# Patient Record
Sex: Male | Born: 1965
Health system: Southern US, Community
[De-identification: ages and names within clinical notes are randomized; demographics above are authoritative.]

## PROBLEM LIST (undated history)

## (undated) DIAGNOSIS — M25529 Pain in unspecified elbow: Secondary | ICD-10-CM

## (undated) DIAGNOSIS — K219 Gastro-esophageal reflux disease without esophagitis: Secondary | ICD-10-CM

## (undated) DIAGNOSIS — I10 Essential (primary) hypertension: Secondary | ICD-10-CM

## (undated) DIAGNOSIS — M25569 Pain in unspecified knee: Secondary | ICD-10-CM

## (undated) HISTORY — PX: NO PAST SURGERIES: SHX2092

## (undated) HISTORY — DX: Pain in unspecified elbow: M25.529

## (undated) HISTORY — DX: Gastro-esophageal reflux disease without esophagitis: K21.9

## (undated) HISTORY — DX: Pain in unspecified knee: M25.569

---

## 2004-06-01 ENCOUNTER — Emergency Department (HOSPITAL_COMMUNITY): Admission: EM | Admit: 2004-06-01 | Discharge: 2004-06-01 | Payer: Self-pay | Admitting: *Deleted

## 2008-10-24 ENCOUNTER — Encounter: Admission: RE | Admit: 2008-10-24 | Discharge: 2008-10-24 | Payer: Self-pay | Admitting: Family Medicine

## 2009-11-11 ENCOUNTER — Encounter: Admission: RE | Admit: 2009-11-11 | Discharge: 2009-11-11 | Payer: Self-pay | Admitting: Family Medicine

## 2016-03-13 ENCOUNTER — Emergency Department (HOSPITAL_COMMUNITY): Payer: Managed Care, Other (non HMO)

## 2016-03-13 ENCOUNTER — Encounter (HOSPITAL_COMMUNITY): Payer: Self-pay | Admitting: *Deleted

## 2016-03-13 ENCOUNTER — Emergency Department (HOSPITAL_COMMUNITY)
Admission: EM | Admit: 2016-03-13 | Discharge: 2016-03-13 | Disposition: A | Discharge status: Home / Self Care | Payer: Managed Care, Other (non HMO) | Attending: Emergency Medicine | Admitting: Emergency Medicine

## 2016-03-13 DIAGNOSIS — Z23 Encounter for immunization: Secondary | ICD-10-CM | POA: Diagnosis not present

## 2016-03-13 DIAGNOSIS — Z7982 Long term (current) use of aspirin: Secondary | ICD-10-CM | POA: Diagnosis not present

## 2016-03-13 DIAGNOSIS — Z79899 Other long term (current) drug therapy: Secondary | ICD-10-CM | POA: Diagnosis not present

## 2016-03-13 DIAGNOSIS — I10 Essential (primary) hypertension: Secondary | ICD-10-CM | POA: Diagnosis not present

## 2016-03-13 DIAGNOSIS — M25562 Pain in left knee: Secondary | ICD-10-CM | POA: Diagnosis present

## 2016-03-13 HISTORY — DX: Essential (primary) hypertension: I10

## 2016-03-13 LAB — BASIC METABOLIC PANEL
Anion gap: 7 (ref 5–15)
BUN: 25 mg/dL — AB (ref 6–20)
CO2: 27 mmol/L (ref 22–32)
CREATININE: 0.84 mg/dL (ref 0.61–1.24)
Calcium: 9.4 mg/dL (ref 8.9–10.3)
Chloride: 105 mmol/L (ref 101–111)
GFR calc Af Amer: >60 mL/min (ref >60)
Glucose, Bld: 113 mg/dL — ABNORMAL HIGH (ref 65–99)
Potassium: 4 mmol/L (ref 3.5–5.1)
SODIUM: 139 mmol/L (ref 135–145)

## 2016-03-13 LAB — CBC WITH DIFFERENTIAL/PLATELET
Basophils Absolute: 0 10*3/uL (ref 0.0–0.1)
Basophils Relative: 0 %
EOS ABS: 0.2 10*3/uL (ref 0.0–0.7)
EOS PCT: 3 %
HCT: 47.8 % (ref 39.0–52.0)
Hemoglobin: 16.1 g/dL (ref 13.0–17.0)
LYMPHS ABS: 2.1 10*3/uL (ref 0.7–4.0)
Lymphocytes Relative: 31 %
MCH: 30 pg (ref 26.0–34.0)
MCHC: 33.7 g/dL (ref 30.0–36.0)
MCV: 89.2 fL (ref 78.0–100.0)
MONOS PCT: 9 %
Monocytes Absolute: 0.6 10*3/uL (ref 0.1–1.0)
Neutro Abs: 3.9 10*3/uL (ref 1.7–7.7)
Neutrophils Relative %: 57 %
PLATELETS: 178 10*3/uL (ref 150–400)
RBC: 5.36 MIL/uL (ref 4.22–5.81)
RDW: 12.8 % (ref 11.5–15.5)
WBC: 6.8 10*3/uL (ref 4.0–10.5)

## 2016-03-13 LAB — C-REACTIVE PROTEIN: CRP: 1.1 mg/dL — ABNORMAL HIGH (ref <1.0)

## 2016-03-13 LAB — SEDIMENTATION RATE: SED RATE: 12 mm/h (ref 0–16)

## 2016-03-13 MED ORDER — LIDOCAINE-EPINEPHRINE (PF) 1 %-1:200000 IJ SOLN
INTRAMUSCULAR | Status: AC
  Filled 2016-03-13: qty 30

## 2016-03-13 MED ORDER — HYDROCODONE-ACETAMINOPHEN 5-325 MG PO TABS: 2.0000 | ORAL_TABLET | ORAL | Status: DC | PRN

## 2016-03-13 MED ORDER — HYDROCODONE-ACETAMINOPHEN 5-325 MG PO TABS
1.0000 | ORAL_TABLET | Freq: Once | ORAL | Status: AC
  Administered 2016-03-13: 1 via ORAL
  Filled 2016-03-13: qty 1

## 2016-03-13 MED ORDER — LIDOCAINE-EPINEPHRINE (PF) 1 %-1:200000 IJ SOLN
20.0000 mL | Freq: Once | INTRAMUSCULAR | Status: DC
  Filled 2016-03-13: qty 20

## 2016-03-13 MED ORDER — TETANUS-DIPHTH-ACELL PERTUSSIS 5-2.5-18.5 LF-MCG/0.5 IM SUSP
0.5000 mL | Freq: Once | INTRAMUSCULAR | Status: AC
  Administered 2016-03-13: 0.5 mL via INTRAMUSCULAR
  Filled 2016-03-13: qty 0.5

## 2016-03-13 NOTE — ED Provider Notes (Signed)
CSN: [id number]     Arrival date & time 03/13/16  0331 History   First MD Initiated Contact with Patient 03/13/16 0435     Chief Complaint  Patient presents with  . Knee Pain     (Consider location/radiation/quality/duration/timing/severity/associated sxs/prior Treatment) HPI Comments: Patient complains of left knee pain ongoing since May 26. He states he hit his knee on the edge of his trailer and has had suprapatellar pain since then. He has had increased pain tonight with difficulty walking. He was seen at urgent care on May 31 and started on doxycycline for possible cellulitis which he's been taking. He also told urgent care that he had a tick bite to his knee several weeks ago. He denies any fever, chills, nausea or vomiting. No chest pain or shortness of breath. No previous issues with this knee. No significant bleeding or drainage. No weakness, numbness or tingling.  The history is provided by the patient and a relative.    Past Medical History  Diagnosis Date  . Hypertension    History reviewed. No pertinent past surgical history. No family history on file. Social History  Substance Use Topics  . Smoking status: Never Smoker   . Smokeless tobacco: None  . Alcohol Use: No    Review of Systems  Constitutional: Negative for chills, activity change, appetite change and fatigue.  HENT: Negative for congestion and rhinorrhea.   Respiratory: Negative for cough, chest tightness and shortness of breath.   Cardiovascular: Negative for chest pain and leg swelling.  Gastrointestinal: Negative for nausea, vomiting and abdominal pain.  Genitourinary: Negative for dysuria and hematuria.  Musculoskeletal: Positive for myalgias and arthralgias.  Skin: Positive for wound. Negative for rash.  Neurological: Negative for dizziness, weakness and headaches.  A complete 10 system review of systems was obtained and all systems are negative except as noted in the HPI and PMH.      Allergies   Review of patient's allergies indicates no known allergies.  Home Medications   Prior to Admission medications   Medication Sig Start Date End Date Taking? Authorizing Provider  aspirin 81 MG tablet Take 81 mg by mouth daily.   Yes Historical Provider, MD  doxycycline (VIBRAMYCIN) 100 MG capsule Take 100 mg by mouth 2 (two) times daily.   Yes Historical Provider, MD  hydrochlorothiazide (HYDRODIURIL) 25 MG tablet Take 25 mg by mouth daily.   Yes Historical Provider, MD   BP 156/91 mmHg  Pulse 82  Temp(Src) 99.2 F (37.3 C) (Oral)  Resp 20  Ht 6' 3"  (1.905 m)  Wt 262 lb (118.842 kg)  BMI 32.75 kg/m2  SpO2 97% Physical Exam  Constitutional: He is oriented to person, place, and time. He appears well-developed and well-nourished. No distress.  HENT:  Head: Normocephalic and atraumatic.  Mouth/Throat: Oropharynx is clear and moist. No oropharyngeal exudate.  Eyes: Conjunctivae and EOM are normal. Pupils are equal, round, and reactive to light.  Neck: Normal range of motion. Neck supple.  No meningismus.  Cardiovascular: Normal rate, regular rhythm, normal heart sounds and intact distal pulses.   No murmur heard. Pulmonary/Chest: Effort normal and breath sounds normal. No respiratory distress.  Abdominal: Soft. There is no tenderness. There is no rebound and no guarding.  Musculoskeletal: Normal range of motion. He exhibits edema and tenderness.  Small abrasion left suprapatellar area. There is no surrounding erythema. Left knee joint itself shows mild effusion. There is mild warmth but no erythema. Flexion and extension are intact but limited by  pain. No pain with manipulation of patella Intact DP and PT pulses.  Neurological: He is alert and oriented to person, place, and time. No cranial nerve deficit. He exhibits normal muscle tone. Coordination normal.  No ataxia on finger to nose bilaterally. No pronator drift. 5/5 strength throughout. CN 2-12 intact.Equal grip strength.  Sensation intact.   Skin: Skin is warm.  Psychiatric: He has a normal mood and affect. His behavior is normal.  Nursing note and vitals reviewed.   ED Course  .Joint Aspiration/Arthrocentesis Date/Time: 03/13/2016 8:20 AM Performed by: Ezequiel Essex Authorized by: Ezequiel Essex Consent: Verbal consent obtained. Risks and benefits: risks, benefits and alternatives were discussed Consent given by: patient Patient understanding: patient states understanding of the procedure being performed Patient consent: the patient's understanding of the procedure matches consent given Patient identity confirmed: provided demographic data and verbally with patient Time out: Immediately prior to procedure a "time out" was called to verify the correct patient, procedure, equipment, support staff and site/side marked as required. Indications: joint swelling,  pain,  possible septic joint and diagnostic evaluation  Body area: knee Joint: left knee Local anesthesia used: yes Anesthesia: local infiltration Local anesthetic: lidocaine 1% with epinephrine Anesthetic total: 4 ml Patient sedated: no Preparation: Patient was prepped and draped in the usual sterile fashion. Needle gauge: 18 G Ultrasound guidance: no Approach: medial Aspirate amount: 0 mL Patient tolerance: Patient tolerated the procedure well with no immediate complications Comments: No synovial fluid obtained. Patient requests to stop and declines further attempts   (including critical care time) Labs Review Labs Reviewed  CBC WITH DIFFERENTIAL/PLATELET  BASIC METABOLIC PANEL  SEDIMENTATION RATE  C-REACTIVE PROTEIN    Imaging Review No results found. I have personally reviewed and evaluated these images and lab results as part of my medical decision-making.   EKG Interpretation None      MDM   Final diagnoses:  Left knee pain   Ongoing left knee pain since sustaining abrasion 2 weeks ago. No fever. Mentions "stool  darker than normal" yesterday. No frank blood, not black.  He declines rectal exam.  No cellulitis on exam. Septic joint considered but seems unlikely with minimal effusion and intact ROM. Patient is able to flex and extend his knee without difficulty.  We'll update tetanus. We'll obtain x-rays patient as states this was not done.  Xray negative. No joint effusion. WBC normal. Afebrile.  Hemoglobin 16, vitals stable.  He declines rectal exam or any evaluation of his reported dark stools  Discussed with patient risks and benefits of aspiration. He has a great deal of pain with range of motion and some warmth but no significant erythema. No fever. No leukocytosis. ESR normal.  Patient initially declines aspiration but now is agreeable. Aspiration attempted as above. No synovial fluid obtained. Patient declines further aspiration attempts. Continue doxycycline., Follow-up with orthopedics tomorrow. If unable to see orthopedics, recommended return to emergency department for reevaluation. Return sooner with worsening pain, fever, vomiting or any other concerns.  Ezequiel Essex, MD 03/13/16 [phone]

## 2016-03-13 NOTE — ED Notes (Signed)
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

## 2016-03-13 NOTE — Discharge Instructions (Signed)
Joint Pain Follow up with Dr. Romeo AppleHarrison tomorrow. If unable to see him, return to the ED for a recheck. Continue the antibiotics. Return to the ED sooner if you develop worsening pain, redness, fever, or any other concerns. Joint pain, which is also called arthralgia, can be caused by many things. Joint pain often goes away when you follow your health care provider's instructions for relieving pain at home. However, joint pain can also be caused by conditions that require further treatment. Common causes of joint pain include:  Bruising in the area of the joint.  Overuse of the joint.  Wear and tear on the joints that occur with aging (osteoarthritis).  Various other forms of arthritis.  A buildup of a crystal form of uric acid in the joint (gout).  Infections of the joint (septic arthritis) or of the bone (osteomyelitis). Your health care provider may recommend medicine to help with the pain. If your joint pain continues, additional tests may be needed to diagnose your condition. HOME CARE INSTRUCTIONS Watch your condition for any changes. Follow these instructions as directed to lessen the pain that you are feeling.  Take medicines only as directed by your health care provider.  Rest the affected area for as long as your health care provider says that you should. If directed to do so, raise the painful joint above the level of your heart while you are sitting or lying down.  Do not do things that cause or worsen pain.  If directed, apply ice to the painful area:  Put ice in a plastic bag.  Place a towel between your skin and the bag.  Leave the ice on for 20 minutes, 2-3 times per day.  Wear an elastic bandage, splint, or sling as directed by your health care provider. Loosen the elastic bandage or splint if your fingers or toes become numb and tingle, or if they turn cold and blue.  Begin exercising or stretching the affected area as directed by your health care provider. Ask  your health care provider what types of exercise are safe for you.  Keep all follow-up visits as directed by your health care provider. This is important. SEEK MEDICAL CARE IF:  Your pain increases, and medicine does not help.  Your joint pain does not improve within 3 days.  You have increased bruising or swelling.  You have a fever.  You lose 10 lb (4.5 kg) or more without trying. SEEK IMMEDIATE MEDICAL CARE IF:  You are not able to move the joint.  Your fingers or toes become numb or they turn cold and blue.   This information is not intended to replace advice given to you by your health care provider. Make sure you discuss any questions you have with your health care provider.   Document Released: 09/26/2005 Document Revised: 10/17/2014 Document Reviewed: 07/08/2014 Elsevier Interactive Patient Education [employer]2016 Elsevier Inc.

## 2016-03-13 NOTE — ED Notes (Addendum)
Ice Pak given at this time. 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 States that he went to urgent care, put on antibiotics. States that he is having black stools.

## 2016-03-14 ENCOUNTER — Encounter: Payer: Self-pay | Admitting: Orthopedic Surgery

## 2016-03-14 ENCOUNTER — Ambulatory Visit (INDEPENDENT_AMBULATORY_CARE_PROVIDER_SITE_OTHER): Payer: Managed Care, Other (non HMO) | Admitting: Orthopedic Surgery

## 2016-03-14 VITALS — BP 136/100 | Ht 75.0 in | Wt 262.0 lb

## 2016-03-14 DIAGNOSIS — M25469 Effusion, unspecified knee: Secondary | ICD-10-CM | POA: Diagnosis not present

## 2016-03-14 LAB — CBC WITH DIFFERENTIAL/PLATELET
BASOS PCT: 0 %
Basophils Absolute: 0 cells/uL (ref 0–200)
Eosinophils Absolute: 138 cells/uL (ref 15–500)
Eosinophils Relative: 2 %
HEMATOCRIT: 49 % (ref 38.5–50.0)
HEMOGLOBIN: 17.2 g/dL — AB (ref 13.2–17.1)
LYMPHS ABS: 2001 {cells}/uL (ref 850–3900)
Lymphocytes Relative: 29 %
MCH: 30.7 pg (ref 27.0–33.0)
MCHC: 35.1 g/dL (ref 32.0–36.0)
MCV: 87.5 fL (ref 80.0–100.0)
MONO ABS: 690 {cells}/uL (ref 200–950)
MPV: 10.3 fL (ref 7.5–12.5)
Monocytes Relative: 10 %
NEUTROS PCT: 59 %
Neutro Abs: 4071 cells/uL (ref 1500–7800)
Platelets: 198 10*3/uL (ref 140–400)
RBC: 5.6 MIL/uL (ref 4.20–5.80)
RDW: 13.6 % (ref 11.0–15.0)
WBC: 6.9 10*3/uL (ref 3.8–10.8)

## 2016-03-14 LAB — SEDIMENTATION RATE: SED RATE: 17 mm/h — AB (ref 0–15)

## 2016-03-14 LAB — C-REACTIVE PROTEIN

## 2016-03-14 LAB — URIC ACID: Uric Acid, Serum: 7.5 mg/dL (ref 4.0–8.0)

## 2016-03-14 MED ORDER — PREDNISONE 10 MG (48) PO TBPK: ORAL_TABLET | ORAL | Status: DC

## 2016-03-14 MED ORDER — HYDROCODONE-ACETAMINOPHEN 5-325 MG PO TABS: 1.0000 | ORAL_TABLET | Freq: Four times a day (QID) | ORAL | Status: DC | PRN

## 2016-03-14 NOTE — Progress Notes (Addendum)
Chief Complaint  Patient presents with  . Knee Injury    ER FOLLOW UP LEFT KNEE INJURY, DOI 03/04/16    Medical decision-making  X-rays personal interpretation 4 views of the knee no fracture no dislocation or significant degenerative change  Report read as IMPRESSION: 1. No acute osseous abnormality about the knee. 2. Mild degenerative osteoarthritic changes involving the patellofemoral joint space compartment.     Electronically Signed   By: Jeannine Boga M.D.   On: 03/13/2016 05:59  Diagnosis left knee pain possible infection possible posttraumatic synovitis  Recommend laboratory studies to be done CBC with differential, C-reactive protein, sedimentation rate  LAB RESULT: 03/15/16 WBC 6.9  URIC ACID 7.5 (NL 4-8) CRP<0.6 AND ESR 17 (NL =0-15) 03/15/2016 9:37 AM  Come back 2 days  Prescriptions written for pain Steroids added  Stop ibuprofen   HPI 50 year old male who injured his left knee kneecap versus trailer about 3 days later saw him pain and swelling went to the ER didn't aspiration was negative ultrasound showed minimal fluid in the joint was put on doxycycline for presumed prophylactic antibiotics for infection  Comes in today complaining of anterior left knee pain diffuse joint pain dull throbbing severe now of 10 days duration pain has become constant and he has trouble bending his knee  Review of Systems  Constitutional: Negative for fever and chills.  Skin: Negative for itching and rash.  Neurological: Negative for tingling.    Past Medical History  Diagnosis Date  . Hypertension     No past surgical history on file. No family history on file. Social History  Substance Use Topics  . Smoking status: Never Smoker   . Smokeless tobacco: None  . Alcohol Use: No    Current outpatient prescriptions:  .  aspirin 81 MG tablet, Take 81 mg by mouth daily., Disp: , Rfl:  .  doxycycline (VIBRAMYCIN) 100 MG capsule, Take 100 mg by mouth 2 (two) times  daily., Disp: , Rfl:  .  hydrochlorothiazide (HYDRODIURIL) 25 MG tablet, Take 25 mg by mouth daily., Disp: , Rfl:  .  HYDROcodone-acetaminophen (NORCO/VICODIN) 5-325 MG tablet, Take 1 tablet by mouth every 6 (six) hours as needed., Disp: 56 tablet, Rfl: 0 .  predniSONE (STERAPRED UNI-PAK 48 TAB) 10 MG (48) TBPK tablet, Use as directed, Disp: 48 tablet, Rfl: 0  BP 136/100 mmHg  Ht 6' 3"  (1.905 m)  Wt 262 lb (118.842 kg)  BMI 32.75 kg/m2  Physical Exam  Constitutional: He is oriented to person, place, and time. He appears well-developed and well-nourished. No distress.  Cardiovascular: Normal rate and intact distal pulses.   Neurological: He is alert and oriented to person, place, and time.  Skin: Skin is warm and dry. No rash noted. He is not diaphoretic. No erythema. No pallor.  Psychiatric: He has a normal mood and affect. His behavior is normal. Judgment and thought content normal.    Ortho Exam Ambulatory status is walking with crutches he has a significant limp. His knee is swollen but the joint has no fluid. He can only bend his knee 30. He has no extensor lag with straight leg raises knee is stable and the collateral and sagittal plane is no weakness in the knee and no atrophy. The skin is not red but there is an abrasion superior pole of patella. Distal pulses intact temperature normal no edema lymph nodes are negative sensation is normal

## 2016-03-15 NOTE — Addendum Note (Signed)
Addended by: Vickki HearingHARRISON, STANLEY E on: 03/15/2016 09:37 AM   Modules accepted: Kipp BroodSmartSet

## 2016-03-16 ENCOUNTER — Ambulatory Visit (INDEPENDENT_AMBULATORY_CARE_PROVIDER_SITE_OTHER): Payer: Managed Care, Other (non HMO) | Admitting: Orthopedic Surgery

## 2016-03-16 VITALS — BP 143/94 | HR 101 | Ht 75.0 in | Wt 257.6 lb

## 2016-03-16 DIAGNOSIS — M25562 Pain in left knee: Secondary | ICD-10-CM

## 2016-03-16 DIAGNOSIS — M25469 Effusion, unspecified knee: Secondary | ICD-10-CM | POA: Diagnosis not present

## 2016-03-16 NOTE — Progress Notes (Signed)
Patient ID: Seth Garcia, male   DOB: [birth year], 50 y.o.   MRN: [id number]  Chief Complaint  Patient presents with  . Follow-up    Left knee    HPI - PRIOR HPI 50 year old male who injured his left knee kneecap versus trailer about 3 days later saw him pain and swelling went to the ER didn't aspiration was negative ultrasound showed minimal fluid in the joint was put on doxycycline for presumed prophylactic antibiotics for infection  Comes in today complaining of anterior left knee pain diffuse joint pain dull throbbing severe now of 10 days duration pain has become constant and he has trouble bending his knee   ROS  Review of Systems  Constitutional: Negative for fever and chills.  Skin: Negative for itching and rash.  Neurological: Negative for tingling.   TODAY HE SAYS THE KNEE IS MUCH BETTER  HE CAN BEND IT AND HIS PAIN IS SIGNIF. LESS   BP 143/94 mmHg  Pulse 101  Ht 6' 3"  (1.905 m)  Wt 257 lb 9.6 oz (116.847 kg)  BMI 32.20 kg/m2 Gen. appearance is normal grooming and hygiene Orientation to person place and time normal Mood normal Gait is normal NOW No peripheral edema or swelling is noted in the RIGHT LEG Sensory exam shows normal sensation to palpation, pressure and soft touch Skin exam no lacerations ulcerations or erythema  Left Knee Exam   Tenderness  The patient is experiencing no tenderness.     Range of Motion  The patient has normal left knee ROM.  Other  Erythema: absent Sensation: normal Pulse: present Swelling: none Effusion: no effusion present       A/P  Medical decision-making  LABS WERE NORMAL WITH ESR 17  Arther Abbott, MD 03/16/2016 4:03 PM

## 2016-04-14 ENCOUNTER — Ambulatory Visit: Payer: Managed Care, Other (non HMO) | Admitting: Orthopedic Surgery

## 2016-12-16 DIAGNOSIS — M79671 Pain in right foot: Secondary | ICD-10-CM | POA: Diagnosis not present

## 2016-12-16 DIAGNOSIS — G8929 Other chronic pain: Secondary | ICD-10-CM | POA: Diagnosis not present

## 2016-12-16 DIAGNOSIS — M722 Plantar fascial fibromatosis: Secondary | ICD-10-CM | POA: Diagnosis not present

## 2016-12-16 DIAGNOSIS — M6701 Short Achilles tendon (acquired), right ankle: Secondary | ICD-10-CM | POA: Diagnosis not present

## 2017-08-01 DIAGNOSIS — Z23 Encounter for immunization: Secondary | ICD-10-CM | POA: Diagnosis not present

## 2017-08-01 DIAGNOSIS — Z125 Encounter for screening for malignant neoplasm of prostate: Secondary | ICD-10-CM | POA: Diagnosis not present

## 2017-08-01 DIAGNOSIS — E785 Hyperlipidemia, unspecified: Secondary | ICD-10-CM | POA: Diagnosis not present

## 2017-08-01 DIAGNOSIS — Z0000 Encounter for general adult medical examination without abnormal findings: Secondary | ICD-10-CM | POA: Diagnosis not present

## 2017-08-22 DIAGNOSIS — I10 Essential (primary) hypertension: Secondary | ICD-10-CM | POA: Diagnosis not present

## 2017-12-28 DIAGNOSIS — M722 Plantar fascial fibromatosis: Secondary | ICD-10-CM | POA: Diagnosis not present

## 2017-12-28 DIAGNOSIS — M71571 Other bursitis, not elsewhere classified, right ankle and foot: Secondary | ICD-10-CM | POA: Diagnosis not present

## 2017-12-28 DIAGNOSIS — M7731 Calcaneal spur, right foot: Secondary | ICD-10-CM | POA: Diagnosis not present

## 2017-12-28 DIAGNOSIS — M76821 Posterior tibial tendinitis, right leg: Secondary | ICD-10-CM | POA: Diagnosis not present

## 2018-01-04 DIAGNOSIS — M722 Plantar fascial fibromatosis: Secondary | ICD-10-CM | POA: Diagnosis not present

## 2018-01-04 DIAGNOSIS — M71571 Other bursitis, not elsewhere classified, right ankle and foot: Secondary | ICD-10-CM | POA: Diagnosis not present

## 2018-01-15 DIAGNOSIS — M71571 Other bursitis, not elsewhere classified, right ankle and foot: Secondary | ICD-10-CM | POA: Diagnosis not present

## 2018-01-15 DIAGNOSIS — M722 Plantar fascial fibromatosis: Secondary | ICD-10-CM | POA: Diagnosis not present

## 2018-08-09 DIAGNOSIS — R7309 Other abnormal glucose: Secondary | ICD-10-CM | POA: Diagnosis not present

## 2018-08-09 DIAGNOSIS — Z136 Encounter for screening for cardiovascular disorders: Secondary | ICD-10-CM | POA: Diagnosis not present

## 2018-08-09 DIAGNOSIS — Z125 Encounter for screening for malignant neoplasm of prostate: Secondary | ICD-10-CM | POA: Diagnosis not present

## 2018-08-09 DIAGNOSIS — Z0000 Encounter for general adult medical examination without abnormal findings: Secondary | ICD-10-CM | POA: Diagnosis not present

## 2018-08-09 DIAGNOSIS — E785 Hyperlipidemia, unspecified: Secondary | ICD-10-CM | POA: Diagnosis not present

## 2018-08-09 DIAGNOSIS — Z23 Encounter for immunization: Secondary | ICD-10-CM | POA: Diagnosis not present

## 2018-08-22 DIAGNOSIS — L821 Other seborrheic keratosis: Secondary | ICD-10-CM | POA: Diagnosis not present

## 2018-08-22 DIAGNOSIS — D225 Melanocytic nevi of trunk: Secondary | ICD-10-CM | POA: Diagnosis not present

## 2018-08-22 DIAGNOSIS — L820 Inflamed seborrheic keratosis: Secondary | ICD-10-CM | POA: Diagnosis not present

## 2019-08-29 DIAGNOSIS — Z125 Encounter for screening for malignant neoplasm of prostate: Secondary | ICD-10-CM | POA: Diagnosis not present

## 2019-08-29 DIAGNOSIS — R7309 Other abnormal glucose: Secondary | ICD-10-CM | POA: Diagnosis not present

## 2019-08-29 DIAGNOSIS — Z23 Encounter for immunization: Secondary | ICD-10-CM | POA: Diagnosis not present

## 2019-08-29 DIAGNOSIS — I10 Essential (primary) hypertension: Secondary | ICD-10-CM | POA: Diagnosis not present

## 2019-08-29 DIAGNOSIS — E785 Hyperlipidemia, unspecified: Secondary | ICD-10-CM | POA: Diagnosis not present

## 2019-08-29 DIAGNOSIS — Z0000 Encounter for general adult medical examination without abnormal findings: Secondary | ICD-10-CM | POA: Diagnosis not present

## 2019-09-12 DIAGNOSIS — I10 Essential (primary) hypertension: Secondary | ICD-10-CM | POA: Diagnosis not present

## 2019-09-25 ENCOUNTER — Encounter: Payer: Self-pay | Admitting: General Practice

## 2019-11-01 ENCOUNTER — Ambulatory Visit (INDEPENDENT_AMBULATORY_CARE_PROVIDER_SITE_OTHER): Payer: BC Managed Care – PPO | Admitting: Interventional Cardiology

## 2019-11-01 ENCOUNTER — Other Ambulatory Visit: Payer: Self-pay

## 2019-11-01 ENCOUNTER — Encounter (INDEPENDENT_AMBULATORY_CARE_PROVIDER_SITE_OTHER): Payer: Self-pay

## 2019-11-01 ENCOUNTER — Encounter: Payer: Self-pay | Admitting: Interventional Cardiology

## 2019-11-01 VITALS — BP 150/84 | HR 65 | Ht 75.0 in | Wt 278.8 lb

## 2019-11-01 DIAGNOSIS — R7303 Prediabetes: Secondary | ICD-10-CM | POA: Diagnosis not present

## 2019-11-01 DIAGNOSIS — R072 Precordial pain: Secondary | ICD-10-CM | POA: Diagnosis not present

## 2019-11-01 DIAGNOSIS — I10 Essential (primary) hypertension: Secondary | ICD-10-CM

## 2019-11-01 NOTE — Patient Instructions (Addendum)
Medication Instructions:  Your physician recommends that you continue on your current medications as directed. Please refer to the Current Medication list given to you today.  *If you need a refill on your cardiac medications before your next appointment, please call your pharmacy*  Lab Work: None ordered  If you have labs (blood work) drawn today and your tests are completely normal, you will receive your results only by: Marland Kitchen MyChart Message (if you have MyChart) OR . A paper copy in the mail If you have any lab test that is abnormal or we need to change your treatment, we will call you to review the results.  Testing/Procedures: You will have to have a COVID test done prior to you exercise tolerance test. Your Pre-procedure COVID-19 Testing will be done on ____ at [facility] at [address], [place], [place] [address]. Once you arrive at the testing site, stay in the right hand lane, go under the building overhang not the tent. If you are tested under the tent your results may not be back before your procedure. Please be on time for your appointment.  After your swab you will be given a mask to wear and instructed to go home and quarantine/no visitors until after your procedure. If you test positive you will be notified and your procedure will be cancelled.   Your physician has requested that you have an exercise tolerance test. For further information please visit [email or web address] Please also follow instruction sheet, as given.   Follow-Up: AS NEEDED  Other Instructions   Heart-Healthy Eating Plan Heart-healthy meal planning includes:  Eating less unhealthy fats.  Eating more healthy fats.  Making other changes in your diet. Talk with your doctor or a diet specialist (dietitian) to create an eating plan that is right for you. What is my plan? Your doctor may recommend an eating plan that includes:  Total fat: ______% or less of total  calories a day.  Saturated fat: ______% or less of total calories a day.  Cholesterol: less than _________mg a day. What are tips for following this plan? Cooking Avoid frying your food. Try to bake, boil, grill, or broil it instead. You can also reduce fat by:  Removing the skin from poultry.  Removing all visible fats from meats.  Steaming vegetables in water or broth. Meal planning   At meals, divide your plate into four equal parts: ? Fill one-half of your plate with vegetables and green salads. ? Fill one-fourth of your plate with whole grains. ? Fill one-fourth of your plate with lean protein foods.  Eat 4-5 servings of vegetables per day. A serving of vegetables is: ? 1 cup of raw or cooked vegetables. ? 2 cups of raw leafy greens.  Eat 4-5 servings of fruit per day. A serving of fruit is: ? 1 medium whole fruit. ?  cup of dried fruit. ?  cup of fresh, frozen, or canned fruit. ?  cup of 100% fruit juice.  Eat more foods that have soluble fiber. These are apples, broccoli, carrots, beans, peas, and barley. Try to get 20-30 g of fiber per day.  Eat 4-5 servings of nuts, legumes, and seeds per week: ? 1 serving of dried beans or legumes equals  cup after being cooked. ? 1 serving of nuts is  cup. ? 1 serving of seeds equals 1 tablespoon. General information  Eat more home-cooked food. Eat less restaurant, buffet, and fast food.  Limit or avoid alcohol.  Limit foods that are high in starch and sugar.  Avoid fried foods.  Lose weight if you are overweight.  Keep track of how much salt (sodium) you eat. This is important if you have high blood pressure. Ask your doctor to tell you more about this.  Try to add vegetarian meals each week. Fats  Choose healthy fats. These include olive oil and canola oil, flaxseeds, walnuts, almonds, and seeds.  Eat more omega-3 fats. These include salmon, mackerel, sardines, tuna, flaxseed oil, and ground flaxseeds. Try to  eat fish at least 2 times each week.  Check food labels. Avoid foods with trans fats or high amounts of saturated fat.  Limit saturated fats. ? These are often found in animal products, such as meats, butter, and cream. ? These are also found in plant foods, such as palm oil, palm kernel oil, and coconut oil.  Avoid foods with partially hydrogenated oils in them. These have trans fats. Examples are stick margarine, some tub margarines, cookies, crackers, and other baked goods. What foods can I eat? Fruits All fresh, canned (in natural juice), or frozen fruits. Vegetables Fresh or frozen vegetables (raw, steamed, roasted, or grilled). Green salads. Grains Most grains. Choose whole wheat and whole grains most of the time. Rice and pasta, including brown rice and pastas made with whole wheat. Meats and other proteins Lean, well-trimmed beef, veal, pork, and lamb. Chicken and [place] without skin. All fish and shellfish. Wild duck, rabbit, pheasant, and venison. Egg whites or low-cholesterol egg substitutes. Dried beans, peas, lentils, and tofu. Seeds and most nuts. Dairy Low-fat or nonfat cheeses, including ricotta and mozzarella. Skim or 1% milk that is liquid, powdered, or evaporated. Buttermilk that is made with low-fat milk. Nonfat or low-fat yogurt. Fats and oils Non-hydrogenated (trans-free) margarines. Vegetable oils, including soybean, sesame, sunflower, olive, peanut, safflower, corn, canola, and cottonseed. Salad dressings or mayonnaise made with a vegetable oil. Beverages Mineral water. Coffee and tea. Diet carbonated beverages. Sweets and desserts Sherbet, gelatin, and fruit ice. Small amounts of dark chocolate. Limit all sweets and desserts. Seasonings and condiments All seasonings and condiments. The items listed above may not be a complete list of foods and drinks you can eat. Contact a dietitian for more options. What foods should I avoid? Fruits Canned fruit in heavy  syrup. Fruit in cream or butter sauce. Fried fruit. Limit coconut. Vegetables Vegetables cooked in cheese, cream, or butter sauce. Fried vegetables. Grains Breads that are made with saturated or trans fats, oils, or whole milk. Croissants. Sweet rolls. Donuts. High-fat crackers, such as cheese crackers. Meats and other proteins Fatty meats, such as hot dogs, ribs, sausage, bacon, rib-eye roast or steak. High-fat deli meats, such as salami and bologna. Caviar. Domestic duck and goose. Organ meats, such as liver. Dairy Cream, sour cream, cream cheese, and creamed cottage cheese. Whole-milk cheeses. Whole or 2% milk that is liquid, evaporated, or condensed. Whole buttermilk. Cream sauce or high-fat cheese sauce. Yogurt that is made from whole milk. Fats and oils Meat fat, or shortening. Cocoa butter, hydrogenated oils, palm oil, coconut oil, palm kernel oil. Solid fats and shortenings, including bacon fat, salt pork, lard, and butter. Nondairy cream substitutes. Salad dressings with cheese or sour cream. Beverages Regular sodas and juice drinks with added sugar. Sweets and desserts Frosting. Pudding. Cookies. Cakes. Pies. Milk chocolate or white chocolate. Buttered syrups. Full-fat ice cream or ice cream drinks. The items listed above may not be a complete list of foods and drinks  to avoid. Contact a dietitian for more information. Summary  Heart-healthy meal planning includes eating less unhealthy fats, eating more healthy fats, and making other changes in your diet.  Eat a balanced diet. This includes fruits and vegetables, low-fat or nonfat dairy, lean protein, nuts and legumes, whole grains, and heart-healthy oils and fats. This information is not intended to replace advice given to you by your health care provider. Make sure you discuss any questions you have with your health care provider. Document Revised: 11/30/2017 Document Reviewed: 11/03/2017 Elsevier Patient Education  2020 [employer].

## 2019-11-01 NOTE — Progress Notes (Signed)
Cardiology Office Note   Date:  11/01/2019   ID:  Seth Garcia, DOB 17-Jan-1966, MRN [id number]  PCP:  [name], MD    No chief complaint on file.  Chest pain  Wt Readings from Last 3 Encounters:  11/01/19 278 lb 12.8 oz (126.5 kg)  03/16/16 257 lb 9.6 oz (116.8 kg)  03/14/16 262 lb (118.8 kg)       History of Present Illness: Seth Garcia is a 54 y.o. male who is being seen today for the evaluation of chest pain at the request of [name], MD.  He was stretching about a month ago and felt a pop in the center of his chest.  He had some mild pain for a few days.  It resolved on its own, and has not returned.  He did take some chewable pills for GERD and that seemed to help.   He does some landscaping.  No chest pain with that activity.  Walking up stairs does not cause any issues.   He had an ETT about 10-12 years ago.   Father died of MI at age 55.  No early CAD.  4 brothers without heart issues.    Never smoker.      Past Medical History:  Diagnosis Date  . Elbow pain   . GERD (gastroesophageal reflux disease)   . Hypertension   . Knee pain     Past Surgical History:  Procedure Laterality Date  . NO PAST SURGERIES       Current Outpatient Medications  Medication Sig Dispense Refill  . aspirin 81 MG tablet Take 81 mg by mouth daily.    Marland Kitchen EPINEPHrine 0.15 MG/0.15ML IJ injection Inject 0.15 mg into the muscle as needed for anaphylaxis.    . hydrochlorothiazide (HYDRODIURIL) 25 MG tablet Take 25 mg by mouth daily.    Marland Kitchen lisinopril (ZESTRIL) 20 MG tablet Take 20 mg by mouth daily.     No current facility-administered medications for this visit.    Allergies:   Patient has no known allergies.    Social History:  The patient  reports that he has never smoked. He has never used smokeless tobacco. He reports that he does not drink alcohol or use drugs.   Family History:  The patient's family history includes Cancer in his father and mother;  Diabetes in his father and mother; Healthy in his brother, brother, brother, brother, daughter, and daughter; Heart Problems in his mother; Heart attack in his father; Hypertension in his father and mother.    ROS:  Please see the history of present illness.   Otherwise, review of systems are positive for chest pain- resolved.   All other systems are reviewed and negative.    PHYSICAL EXAM: VS:  BP (!) 150/84 (BP Location: Right Arm, Patient Position: Sitting, Cuff Size: Normal)   Pulse 65   Ht 6\' 3"  (1.905 m)   Wt 278 lb 12.8 oz (126.5 kg)   SpO2 98%   BMI 34.85 kg/m  , BMI Body mass index is 34.85 kg/m. GEN: Well nourished, well developed, in no acute distress  HEENT: normal  Neck: no JVD, carotid bruits, or masses Cardiac: RRR; no murmurs, rubs, or gallops,no edema  Respiratory:  clear to auscultation bilaterally, normal work of breathing GI: soft, nontender, nondistended, + BS MS: no deformity or atrophy  Skin: warm and dry, no rash Neuro:  Strength and sensation are intact Psych: euthymic mood, full affect   EKG:  The ekg ordered today demonstrates NSR, no ST changes   Recent Labs: No results found for requested labs within last 8760 hours.   Lipid Panel No results found for: CHOL, TRIG, HDL, CHOLHDL, VLDL, LDLCALC, LDLDIRECT   Other studies Reviewed: Additional studies/ records that were reviewed today with results demonstrating: .   ASSESSMENT AND PLAN:  1. Atypical chest pain: It does not sound cardiac, but has some RF for CAD.  ETT to get a baseline.  Can do after his colonoscopy. Not urgent. 2. HTN: Home readings are in the 128-130's range at home.  CONtinue BP meds.   3. Whole food plant based diet recommended.  A1C 5.8.  Avoid sweets.  Talked about prediabetes.  Mentioned by Dr. Kateri Plummer as well.    Current medicines are reviewed at length with the patient today.  The patient concerns regarding his medicines were addressed.  The following changes have been  made:  No change  Labs/ tests ordered today include: ETT No orders of the defined types were placed in this encounter.   Recommend 150 minutes/week of aerobic exercise Low fat, low carb, high fiber diet recommended  Disposition:   FU for ETT   Signed, Lance Muss, MD  11/01/2019 9:19 AM    [facility] [address] [place], [place], [place]  [address] Phone: [phone]; Fax: [phone]

## 2019-11-12 DIAGNOSIS — Z1159 Encounter for screening for other viral diseases: Secondary | ICD-10-CM | POA: Diagnosis not present

## 2019-11-15 ENCOUNTER — Other Ambulatory Visit (HOSPITAL_COMMUNITY)
Admission: RE | Admit: 2019-11-15 | Discharge: 2019-11-15 | Disposition: A | Discharge status: Home / Self Care | Payer: BC Managed Care – PPO | Source: Ambulatory Visit | Attending: Interventional Cardiology | Admitting: Interventional Cardiology

## 2019-11-15 DIAGNOSIS — Z20822 Contact with and (suspected) exposure to covid-19: Secondary | ICD-10-CM | POA: Diagnosis not present

## 2019-11-15 DIAGNOSIS — D124 Benign neoplasm of descending colon: Secondary | ICD-10-CM | POA: Diagnosis not present

## 2019-11-15 DIAGNOSIS — Z01812 Encounter for preprocedural laboratory examination: Secondary | ICD-10-CM | POA: Insufficient documentation

## 2019-11-15 DIAGNOSIS — Z1211 Encounter for screening for malignant neoplasm of colon: Secondary | ICD-10-CM | POA: Diagnosis not present

## 2019-11-15 DIAGNOSIS — K621 Rectal polyp: Secondary | ICD-10-CM | POA: Diagnosis not present

## 2019-11-15 LAB — SARS CORONAVIRUS 2 (TAT 6-24 HRS): SARS Coronavirus 2: NEGATIVE

## 2019-11-19 ENCOUNTER — Other Ambulatory Visit: Payer: Self-pay

## 2019-11-19 ENCOUNTER — Ambulatory Visit (INDEPENDENT_AMBULATORY_CARE_PROVIDER_SITE_OTHER): Payer: BC Managed Care – PPO

## 2019-11-19 DIAGNOSIS — R072 Precordial pain: Secondary | ICD-10-CM | POA: Diagnosis not present

## 2019-11-19 LAB — EXERCISE TOLERANCE TEST
Estimated workload: 10.1 METS
Exercise duration (min): 8 min
Exercise duration (sec): 0 s
MPHR: 168 {beats}/min
Peak HR: 164 {beats}/min
Percent HR: 97 %
RPE: 17
Rest HR: 88 {beats}/min

## 2020-09-07 DIAGNOSIS — R509 Fever, unspecified: Secondary | ICD-10-CM | POA: Diagnosis not present

## 2020-09-07 DIAGNOSIS — R059 Cough, unspecified: Secondary | ICD-10-CM | POA: Diagnosis not present

## 2020-09-07 DIAGNOSIS — U071 COVID-19: Secondary | ICD-10-CM | POA: Diagnosis not present

## 2020-09-08 ENCOUNTER — Encounter (HOSPITAL_COMMUNITY): Payer: Self-pay | Admitting: Emergency Medicine

## 2020-09-08 ENCOUNTER — Inpatient Hospital Stay (HOSPITAL_COMMUNITY)
Admission: EM | Admit: 2020-09-08 | Discharge: 2020-09-14 | DRG: 177 | Disposition: A | Discharge status: Home / Self Care | Payer: BC Managed Care – PPO | Attending: Internal Medicine | Admitting: Internal Medicine

## 2020-09-08 ENCOUNTER — Emergency Department (HOSPITAL_COMMUNITY): Payer: BC Managed Care – PPO

## 2020-09-08 ENCOUNTER — Other Ambulatory Visit: Payer: Self-pay

## 2020-09-08 DIAGNOSIS — R739 Hyperglycemia, unspecified: Secondary | ICD-10-CM | POA: Diagnosis not present

## 2020-09-08 DIAGNOSIS — R0902 Hypoxemia: Secondary | ICD-10-CM

## 2020-09-08 DIAGNOSIS — Z23 Encounter for immunization: Secondary | ICD-10-CM

## 2020-09-08 DIAGNOSIS — D696 Thrombocytopenia, unspecified: Secondary | ICD-10-CM | POA: Diagnosis present

## 2020-09-08 DIAGNOSIS — D6959 Other secondary thrombocytopenia: Secondary | ICD-10-CM | POA: Diagnosis present

## 2020-09-08 DIAGNOSIS — J1282 Pneumonia due to coronavirus disease 2019: Secondary | ICD-10-CM | POA: Diagnosis not present

## 2020-09-08 DIAGNOSIS — U071 COVID-19: Secondary | ICD-10-CM | POA: Diagnosis not present

## 2020-09-08 DIAGNOSIS — Z79899 Other long term (current) drug therapy: Secondary | ICD-10-CM

## 2020-09-08 DIAGNOSIS — J9601 Acute respiratory failure with hypoxia: Secondary | ICD-10-CM | POA: Diagnosis present

## 2020-09-08 DIAGNOSIS — Z7982 Long term (current) use of aspirin: Secondary | ICD-10-CM

## 2020-09-08 DIAGNOSIS — T380X5A Adverse effect of glucocorticoids and synthetic analogues, initial encounter: Secondary | ICD-10-CM | POA: Diagnosis not present

## 2020-09-08 DIAGNOSIS — R0602 Shortness of breath: Secondary | ICD-10-CM | POA: Diagnosis not present

## 2020-09-08 DIAGNOSIS — K219 Gastro-esophageal reflux disease without esophagitis: Secondary | ICD-10-CM | POA: Diagnosis present

## 2020-09-08 DIAGNOSIS — J90 Pleural effusion, not elsewhere classified: Secondary | ICD-10-CM | POA: Diagnosis not present

## 2020-09-08 DIAGNOSIS — J9600 Acute respiratory failure, unspecified whether with hypoxia or hypercapnia: Secondary | ICD-10-CM | POA: Diagnosis not present

## 2020-09-08 DIAGNOSIS — Z8249 Family history of ischemic heart disease and other diseases of the circulatory system: Secondary | ICD-10-CM

## 2020-09-08 DIAGNOSIS — I10 Essential (primary) hypertension: Secondary | ICD-10-CM | POA: Diagnosis not present

## 2020-09-08 LAB — CBC WITH DIFFERENTIAL/PLATELET
Abs Immature Granulocytes: 0.02 10*3/uL (ref 0.00–0.07)
Basophils Absolute: 0 10*3/uL (ref 0.0–0.1)
Basophils Relative: 0 %
Eosinophils Absolute: 0 10*3/uL (ref 0.0–0.5)
Eosinophils Relative: 0 %
HCT: 46.5 % (ref 39.0–52.0)
Hemoglobin: 15.7 g/dL (ref 13.0–17.0)
Immature Granulocytes: 0 %
Lymphocytes Relative: 13 %
Lymphs Abs: 0.6 10*3/uL — ABNORMAL LOW (ref 0.7–4.0)
MCH: 30.1 pg (ref 26.0–34.0)
MCHC: 33.8 g/dL (ref 30.0–36.0)
MCV: 89.1 fL (ref 80.0–100.0)
Monocytes Absolute: 0.3 10*3/uL (ref 0.1–1.0)
Monocytes Relative: 6 %
Neutro Abs: 3.7 10*3/uL (ref 1.7–7.7)
Neutrophils Relative %: 81 %
Platelets: 126 10*3/uL — ABNORMAL LOW (ref 150–400)
RBC: 5.22 MIL/uL (ref 4.22–5.81)
RDW: 13.3 % (ref 11.5–15.5)
WBC: 4.5 10*3/uL (ref 4.0–10.5)
nRBC: 0 % (ref 0.0–0.2)

## 2020-09-08 LAB — COMPREHENSIVE METABOLIC PANEL
ALT: 51 U/L — ABNORMAL HIGH (ref 0–44)
AST: 50 U/L — ABNORMAL HIGH (ref 15–41)
Albumin: 3.4 g/dL — ABNORMAL LOW (ref 3.5–5.0)
Alkaline Phosphatase: 45 U/L (ref 38–126)
Anion gap: 13 (ref 5–15)
BUN: 15 mg/dL (ref 6–20)
CO2: 24 mmol/L (ref 22–32)
Calcium: 8.8 mg/dL — ABNORMAL LOW (ref 8.9–10.3)
Chloride: 101 mmol/L (ref 98–111)
Creatinine, Ser: 0.91 mg/dL (ref 0.61–1.24)
GFR, Estimated: >60 mL/min (ref >60)
Glucose, Bld: 137 mg/dL — ABNORMAL HIGH (ref 70–99)
Potassium: 3.5 mmol/L (ref 3.5–5.1)
Sodium: 138 mmol/L (ref 135–145)
Total Bilirubin: 1.2 mg/dL (ref 0.3–1.2)
Total Protein: 7.4 g/dL (ref 6.5–8.1)

## 2020-09-08 LAB — URINALYSIS, ROUTINE W REFLEX MICROSCOPIC
Bacteria, UA: NONE SEEN
Bilirubin Urine: NEGATIVE
Glucose, UA: NEGATIVE mg/dL
Hgb urine dipstick: NEGATIVE
Ketones, ur: NEGATIVE mg/dL
Leukocytes,Ua: NEGATIVE
Nitrite: NEGATIVE
Protein, ur: 100 mg/dL — AB
Specific Gravity, Urine: 1.033 — ABNORMAL HIGH (ref 1.005–1.030)
pH: 5 (ref 5.0–8.0)

## 2020-09-08 LAB — LACTIC ACID, PLASMA: Lactic Acid, Venous: 1.7 mmol/L (ref 0.5–1.9)

## 2020-09-08 MED ORDER — ALBUTEROL SULFATE HFA 108 (90 BASE) MCG/ACT IN AERS: 2.0000 | INHALATION_SPRAY | Freq: Once | RESPIRATORY_TRACT | Status: DC | PRN

## 2020-09-08 MED ORDER — SODIUM CHLORIDE 0.9 % IV SOLN: 1200.0000 mg | Freq: Once | INTRAVENOUS | Status: DC

## 2020-09-08 MED ORDER — SODIUM CHLORIDE 0.9 % IV SOLN: INTRAVENOUS | Status: DC | PRN

## 2020-09-08 MED ORDER — ACETAMINOPHEN 500 MG PO TABS
1000.0000 mg | ORAL_TABLET | Freq: Once | ORAL | Status: AC
  Administered 2020-09-09: 1000 mg via ORAL
  Filled 2020-09-08: qty 2

## 2020-09-08 MED ORDER — SODIUM CHLORIDE 0.9 % IV BOLUS
1000.0000 mL | Freq: Once | INTRAVENOUS | Status: AC
  Administered 2020-09-09: 1000 mL via INTRAVENOUS

## 2020-09-08 MED ORDER — DIPHENHYDRAMINE HCL 50 MG/ML IJ SOLN: 50.0000 mg | Freq: Once | INTRAMUSCULAR | Status: DC | PRN

## 2020-09-08 MED ORDER — EPINEPHRINE 0.3 MG/0.3ML IJ SOAJ
0.3000 mg | Freq: Once | INTRAMUSCULAR | Status: DC | PRN
  Filled 2020-09-08: qty 0.6

## 2020-09-08 MED ORDER — METHYLPREDNISOLONE SODIUM SUCC 125 MG IJ SOLR: 125.0000 mg | Freq: Once | INTRAMUSCULAR | Status: DC | PRN

## 2020-09-08 MED ORDER — SODIUM CHLORIDE 0.9 % IV SOLN
Freq: Once | INTRAVENOUS | Status: AC
  Filled 2020-09-08: qty 20

## 2020-09-08 MED ORDER — FAMOTIDINE IN NACL 20-0.9 MG/50ML-% IV SOLN
20.0000 mg | Freq: Once | INTRAVENOUS | Status: DC | PRN
  Filled 2020-09-08: qty 50

## 2020-09-08 NOTE — ED Triage Notes (Signed)
Patient arrives to ED with complaints of SOB, fever, and chills since Thursday 11/25. Tested positive for COVID yesterday. Pt states his O2 levels have been in the 80's at home and fevers up to 103F.

## 2020-09-08 NOTE — ED Notes (Signed)
SpO2 90% room air, placed on 2 lpm nasal cannula per triage RN. SpO2 increased to 96%.

## 2020-09-09 ENCOUNTER — Encounter (HOSPITAL_COMMUNITY): Payer: Self-pay | Admitting: Internal Medicine

## 2020-09-09 DIAGNOSIS — K219 Gastro-esophageal reflux disease without esophagitis: Secondary | ICD-10-CM

## 2020-09-09 DIAGNOSIS — J9601 Acute respiratory failure with hypoxia: Secondary | ICD-10-CM | POA: Diagnosis not present

## 2020-09-09 DIAGNOSIS — D696 Thrombocytopenia, unspecified: Secondary | ICD-10-CM

## 2020-09-09 DIAGNOSIS — I10 Essential (primary) hypertension: Secondary | ICD-10-CM

## 2020-09-09 DIAGNOSIS — U071 COVID-19: Principal | ICD-10-CM

## 2020-09-09 LAB — COMPREHENSIVE METABOLIC PANEL
ALT: 46 U/L — ABNORMAL HIGH (ref 0–44)
AST: 49 U/L — ABNORMAL HIGH (ref 15–41)
Albumin: 3.1 g/dL — ABNORMAL LOW (ref 3.5–5.0)
Alkaline Phosphatase: 39 U/L (ref 38–126)
Anion gap: 14 (ref 5–15)
BUN: 14 mg/dL (ref 6–20)
CO2: 21 mmol/L — ABNORMAL LOW (ref 22–32)
Calcium: 8.6 mg/dL — ABNORMAL LOW (ref 8.9–10.3)
Chloride: 102 mmol/L (ref 98–111)
Creatinine, Ser: 0.84 mg/dL (ref 0.61–1.24)
GFR, Estimated: >60 mL/min (ref >60)
Glucose, Bld: 156 mg/dL — ABNORMAL HIGH (ref 70–99)
Potassium: 3.9 mmol/L (ref 3.5–5.1)
Sodium: 137 mmol/L (ref 135–145)
Total Bilirubin: 0.7 mg/dL (ref 0.3–1.2)
Total Protein: 6.8 g/dL (ref 6.5–8.1)

## 2020-09-09 LAB — D-DIMER, QUANTITATIVE: D-Dimer, Quant: 0.63 ug/mL-FEU — ABNORMAL HIGH (ref 0.00–0.50)

## 2020-09-09 LAB — URINALYSIS, ROUTINE W REFLEX MICROSCOPIC
Bacteria, UA: NONE SEEN
Bilirubin Urine: NEGATIVE
Glucose, UA: NEGATIVE mg/dL
Hgb urine dipstick: NEGATIVE
Ketones, ur: NEGATIVE mg/dL
Leukocytes,Ua: NEGATIVE
Nitrite: NEGATIVE
Protein, ur: 100 mg/dL — AB
Specific Gravity, Urine: 1.034 — ABNORMAL HIGH (ref 1.005–1.030)
pH: 5 (ref 5.0–8.0)

## 2020-09-09 LAB — CBC WITH DIFFERENTIAL/PLATELET
Abs Immature Granulocytes: 0.03 10*3/uL (ref 0.00–0.07)
Basophils Absolute: 0 10*3/uL (ref 0.0–0.1)
Basophils Relative: 0 %
Eosinophils Absolute: 0 10*3/uL (ref 0.0–0.5)
Eosinophils Relative: 0 %
HCT: 44.9 % (ref 39.0–52.0)
Hemoglobin: 14.7 g/dL (ref 13.0–17.0)
Immature Granulocytes: 1 %
Lymphocytes Relative: 9 %
Lymphs Abs: 0.5 10*3/uL — ABNORMAL LOW (ref 0.7–4.0)
MCH: 29.3 pg (ref 26.0–34.0)
MCHC: 32.7 g/dL (ref 30.0–36.0)
MCV: 89.4 fL (ref 80.0–100.0)
Monocytes Absolute: 0.2 10*3/uL (ref 0.1–1.0)
Monocytes Relative: 4 %
Neutro Abs: 4.2 10*3/uL (ref 1.7–7.7)
Neutrophils Relative %: 86 %
Platelets: 125 10*3/uL — ABNORMAL LOW (ref 150–400)
RBC: 5.02 MIL/uL (ref 4.22–5.81)
RDW: 13.3 % (ref 11.5–15.5)
WBC: 4.9 10*3/uL (ref 4.0–10.5)
nRBC: 0 % (ref 0.0–0.2)

## 2020-09-09 LAB — RESP PANEL BY RT-PCR (FLU A&B, COVID) ARPGX2
Influenza A by PCR: NEGATIVE
Influenza B by PCR: NEGATIVE
SARS Coronavirus 2 by RT PCR: POSITIVE — AB

## 2020-09-09 LAB — PROCALCITONIN: Procalcitonin: <0.1 ng/mL

## 2020-09-09 LAB — HIV ANTIBODY (ROUTINE TESTING W REFLEX): HIV Screen 4th Generation wRfx: NONREACTIVE

## 2020-09-09 MED ORDER — ALBUTEROL SULFATE HFA 108 (90 BASE) MCG/ACT IN AERS: 2.0000 | INHALATION_SPRAY | RESPIRATORY_TRACT | Status: DC | PRN

## 2020-09-09 MED ORDER — ZINC SULFATE 220 (50 ZN) MG PO CAPS
220.0000 mg | ORAL_CAPSULE | Freq: Every day | ORAL | Status: DC
  Administered 2020-09-09 – 2020-09-14 (×6): 220 mg via ORAL
  Filled 2020-09-09 (×6): qty 1

## 2020-09-09 MED ORDER — ENOXAPARIN SODIUM 60 MG/0.6ML ~~LOC~~ SOLN
60.0000 mg | SUBCUTANEOUS | Status: DC
  Administered 2020-09-09 – 2020-09-14 (×6): 60 mg via SUBCUTANEOUS
  Filled 2020-09-09 (×7): qty 0.6

## 2020-09-09 MED ORDER — GUAIFENESIN-DM 100-10 MG/5ML PO SYRP
10.0000 mL | ORAL_SOLUTION | ORAL | Status: DC | PRN
  Filled 2020-09-09: qty 10

## 2020-09-09 MED ORDER — DEXAMETHASONE SODIUM PHOSPHATE 10 MG/ML IJ SOLN
10.0000 mg | Freq: Once | INTRAMUSCULAR | Status: AC
  Administered 2020-09-09: 10 mg via INTRAVENOUS
  Filled 2020-09-09: qty 1

## 2020-09-09 MED ORDER — LISINOPRIL 20 MG PO TABS
20.0000 mg | ORAL_TABLET | Freq: Every day | ORAL | Status: DC
  Administered 2020-09-09 – 2020-09-14 (×6): 20 mg via ORAL
  Filled 2020-09-09 (×6): qty 1

## 2020-09-09 MED ORDER — ONDANSETRON HCL 4 MG/2ML IJ SOLN: 4.0000 mg | Freq: Four times a day (QID) | INTRAMUSCULAR | Status: DC | PRN

## 2020-09-09 MED ORDER — SODIUM CHLORIDE 0.9 % IV SOLN
100.0000 mg | Freq: Every day | INTRAVENOUS | Status: DC
  Administered 2020-09-10 – 2020-09-12 (×3): 100 mg via INTRAVENOUS
  Filled 2020-09-09 (×4): qty 20

## 2020-09-09 MED ORDER — DEXAMETHASONE SODIUM PHOSPHATE 10 MG/ML IJ SOLN
6.0000 mg | INTRAMUSCULAR | Status: DC
  Administered 2020-09-10: 6 mg via INTRAVENOUS
  Filled 2020-09-09: qty 1

## 2020-09-09 MED ORDER — ASPIRIN 81 MG PO CHEW
81.0000 mg | CHEWABLE_TABLET | Freq: Every day | ORAL | Status: DC
  Administered 2020-09-09 – 2020-09-14 (×6): 81 mg via ORAL
  Filled 2020-09-09 (×6): qty 1

## 2020-09-09 MED ORDER — HYDRALAZINE HCL 20 MG/ML IJ SOLN: 10.0000 mg | Freq: Four times a day (QID) | INTRAMUSCULAR | Status: DC | PRN

## 2020-09-09 MED ORDER — ACETAMINOPHEN 325 MG PO TABS
650.0000 mg | ORAL_TABLET | Freq: Four times a day (QID) | ORAL | Status: DC | PRN
  Administered 2020-09-09: 650 mg via ORAL
  Filled 2020-09-09: qty 2

## 2020-09-09 MED ORDER — ORAL CARE MOUTH RINSE
15.0000 mL | Freq: Two times a day (BID) | OROMUCOSAL | Status: DC
  Administered 2020-09-10 – 2020-09-14 (×7): 15 mL via OROMUCOSAL

## 2020-09-09 MED ORDER — ONDANSETRON HCL 4 MG PO TABS: 4.0000 mg | ORAL_TABLET | Freq: Four times a day (QID) | ORAL | Status: DC | PRN

## 2020-09-09 MED ORDER — ASCORBIC ACID 500 MG PO TABS
500.0000 mg | ORAL_TABLET | Freq: Every day | ORAL | Status: DC
  Administered 2020-09-09 – 2020-09-14 (×6): 500 mg via ORAL
  Filled 2020-09-09 (×6): qty 1

## 2020-09-09 MED ORDER — SODIUM CHLORIDE 0.9 % IV SOLN
200.0000 mg | Freq: Once | INTRAVENOUS | Status: AC
  Administered 2020-09-09: 200 mg via INTRAVENOUS
  Filled 2020-09-09: qty 40

## 2020-09-09 MED ORDER — LACTATED RINGERS IV SOLN: INTRAVENOUS | Status: DC

## 2020-09-09 MED ORDER — PANTOPRAZOLE SODIUM 40 MG PO TBEC
40.0000 mg | DELAYED_RELEASE_TABLET | Freq: Every day | ORAL | Status: DC
  Administered 2020-09-09 – 2020-09-14 (×6): 40 mg via ORAL
  Filled 2020-09-09 (×6): qty 1

## 2020-09-09 MED ORDER — POLYETHYLENE GLYCOL 3350 17 G PO PACK: 17.0000 g | PACK | Freq: Every day | ORAL | Status: DC | PRN

## 2020-09-09 NOTE — H&P (Addendum)
History and Physical    Seth Garcia XNA:[id number] DOB: 1966-10-07 DOA: 09/08/2020  PCP: [name], MD  Patient coming from: Home   Chief Complaint:  Chief Complaint  Patient presents with  . Covid Positive     HPI:    54 year old male with past medical history of hypertension who presents to [facility] emergency department with shortness of breath fever and weakness.  Patient explains that on Thanksgiving day he and several of his family members began to develop generalized weakness and fatigue.  Patient states that over the next several days his symptoms continued to progressively worsen.  This became associated with shortness of breath, initially mild in intensity but progressively becoming more more severe.  Shortness of breath became severe in intensity, worse with exertion and improved with rest.  As shortness of breath worsened, patient also developed an associated cough, severe in intensity and dry without sputum production.  Patient also developed extremely poor appetite.  Patient states that over the span of time he also began to exhibit fevers as high as nearly 22 F.  Patient denies any changes in taste, smell or any evidence of diarrhea.  Of note, patient is not vaccinated for COVID-19.  Patient symptoms continued to progressively worsen until he eventually presented to [facility] emergency department for evaluation.  Upon evaluation in the emergency department patient was found to be hypoxic with oxygen saturations as low as the 70s.  Patient was placed on 4 L of supplemental oxygen to maintain oxygen saturations in the low 90s.  Emergency department provider initially felt the patient may be able to potentially go home and was provided with a monoclonal antibody infusion but throughout the ED course the patient continued to exhibit substantial symptoms and oxygen requirement and therefore the hospitalist group has now been called to assess the patient  for mission the hospital.  Review of Systems:   Review of Systems  Constitutional: Positive for fever and malaise/fatigue.  Respiratory: Positive for cough and shortness of breath.   Neurological: Positive for weakness.  All other systems reviewed and are negative.   Past Medical History:  Diagnosis Date  . Elbow pain   . GERD (gastroesophageal reflux disease)   . Hypertension   . Knee pain     Past Surgical History:  Procedure Laterality Date  . NO PAST SURGERIES       reports that he has never smoked. He has never used smokeless tobacco. He reports that he does not drink alcohol and does not use drugs.  No Known Allergies  Family History  Problem Relation Age of Onset  . Heart Problems Mother   . Cancer Mother        THROAT  . Diabetes Mother   . Hypertension Mother   . Heart attack Father   . Cancer Father        SKIN  . Hypertension Father   . Diabetes Father   . Healthy Brother   . Healthy Brother   . Healthy Brother   . Healthy Brother   . Healthy Daughter   . Healthy Daughter      Prior to Admission medications   Medication Sig Start Date End Date Taking? Authorizing Provider  aspirin 81 MG tablet Take 81 mg by mouth daily.    [provider]  EPINEPHrine 0.15 MG/0.15ML IJ injection Inject 0.15 mg into the muscle as needed for anaphylaxis.    [provider]  lisinopril-hydrochlorothiazide (ZESTORETIC) 20-12.5 MG tablet  Take 1 tablet by mouth daily. 06/12/20   [provider]    Physical Exam: Vitals:   09/09/20 0300 09/09/20 0330 09/09/20 0400 09/09/20 0442  BP: 131/74 (!) 145/80 139/81 (!) 151/76  Pulse: 84 82 88 93  Resp: (!) 30 (!) 26 (!) 29 (!) 33  Temp:  99.6 F (37.6 C)    TempSrc:  Oral    SpO2: 93% 98% 96% 92%  Weight:      Height:        Constitutional: Acute alert and oriented x3, no associated distress.   Skin: no rashes, no lesions, notable poor skin turgor. Eyes: Pupils are equally reactive to light.   No evidence of scleral icterus or conjunctival pallor.  ENMT: Dry mucous membranes noted.  Posterior pharynx clear of any exudate or lesions.   Neck: normal, supple, no masses, no thyromegaly.  No evidence of jugular venous distension.   Respiratory: Bibasilar and mid field rales noted without evidence of wheezing.  Patient is somewhat tachypneic with increased respiratory effort without evidence of accessory muscle use.  Cardiovascular: Regular rate and rhythm, no murmurs / rubs / gallops. No extremity edema. 2+ pedal pulses. No carotid bruits.  Chest:   Nontender without crepitus or deformity.   Back:   Nontender without crepitus or deformity. Abdomen: Abdomen is soft and nontender.  No evidence of intra-abdominal masses.  Positive bowel sounds noted in all quadrants.   Musculoskeletal: No joint deformity upper and lower extremities. Good ROM, no contractures. Normal muscle tone.  Neurologic: CN 2-12 grossly intact. Sensation intact.  Patient moving all 4 extremities spontaneously.  Patient is following all commands.  Patient is responsive to verbal stimuli.   Psychiatric: Patient exhibits normal mood with appropriate affect.  Patient seems to possess insight as to their current situation.     Labs on Admission: I have personally reviewed following labs and imaging studies -   CBC: Recent Labs  Lab 09/08/20 1956  WBC 4.5  NEUTROABS 3.7  HGB 15.7  HCT 46.5  MCV 89.1  PLT 126*   Basic Metabolic Panel: Recent Labs  Lab 09/08/20 1956  NA 138  K 3.5  CL 101  CO2 24  GLUCOSE 137*  BUN 15  CREATININE 0.91  CALCIUM 8.8*   GFR: Estimated Creatinine Clearance: 132.4 mL/min (by C-G formula based on SCr of 0.91 mg/dL). Liver Function Tests: Recent Labs  Lab 09/08/20 1956  AST 50*  ALT 51*  ALKPHOS 45  BILITOT 1.2  PROT 7.4  ALBUMIN 3.4*   No results for input(s): LIPASE, AMYLASE in the last 168 hours. No results for input(s): AMMONIA in the last 168 hours. Coagulation  Profile: No results for input(s): INR, PROTIME in the last 168 hours. Cardiac Enzymes: No results for input(s): CKTOTAL, CKMB, CKMBINDEX, TROPONINI in the last 168 hours. BNP (last 3 results) No results for input(s): PROBNP in the last 8760 hours. HbA1C: No results for input(s): HGBA1C in the last 72 hours. CBG: No results for input(s): GLUCAP in the last 168 hours. Lipid Profile: No results for input(s): CHOL, HDL, LDLCALC, TRIG, CHOLHDL, LDLDIRECT in the last 72 hours. Thyroid Function Tests: No results for input(s): TSH, T4TOTAL, FREET4, T3FREE, THYROIDAB in the last 72 hours. Anemia Panel: No results for input(s): VITAMINB12, FOLATE, FERRITIN, TIBC, IRON, RETICCTPCT in the last 72 hours. Urine analysis:    Component Value Date/Time   COLORURINE AMBER (A) 09/09/2020 0353   APPEARANCEUR CLEAR 09/09/2020 0353   LABSPEC 1.034 (H) 09/09/2020 9470  PHURINE 5.0 09/09/2020 0353   GLUCOSEU NEGATIVE 09/09/2020 0353   HGBUR NEGATIVE 09/09/2020 0353   BILIRUBINUR NEGATIVE 09/09/2020 0353   KETONESUR NEGATIVE 09/09/2020 0353   PROTEINUR 100 (A) 09/09/2020 0353   NITRITE NEGATIVE 09/09/2020 0353   LEUKOCYTESUR NEGATIVE 09/09/2020 0353    Radiological Exams on Admission - Personally Reviewed: DG Chest Portable 1 View  Result Date: 09/08/2020 CLINICAL DATA:  Shortness of breath. EXAM: PORTABLE CHEST 1 VIEW COMPARISON:  10/24/2008 FINDINGS: The heart size and mediastinal contours are within normal limits. Diffuse interstitial prominence appears increased since previous study in 2010. This may be due to worsening chronic interstitial changes, although interstitial edema or pneumonitis cannot be excluded. No evidence of pulmonary airspace disease or pleural effusion. IMPRESSION: Increased diffuse interstitial prominence since previous study in 2010. This may be due to worsening chronic interstitial changes, although interstitial edema or pneumonitis cannot be excluded. Electronically Signed    By: Danae OrleansJohn A Stahl M.D.   On: 09/08/2020 20:11    EKG: Personally reviewed.  Rhythm is normal sinus rhythm with heart rate of 97 bpm.  No dynamic ST segment changes appreciated.  Assessment/Plan Principal Problem:   COVID-19 virus infection   Patient presenting with several days of progressively worsening generalized weakness, poor appetite dyspnea on exertion and a dry cough  COVID-19 PCR testing positive.  Chest x-ray reveals bilateral interstitial infiltrates that radiologist reports is chronic however the infiltrates today are likely secondary to COVID-19 infection.  Patient found to be in acute hypoxic respiratory failure in the emergency department, now requiring 4 L of oxygen via nasal cannula.  Upon initial arrival to the emergency department, there was initial intent to send the patient home and the patient received a monoclonal antibody infusion.  However, patient somewhat deteriorated through the out the ED stay  Initiating course of intravenous remdesivir  Initiating intravenous dexamethasone  Continuing supplemental oxygen to achieve target oxygen saturations of 94% or higher.  As needed bronchodilator therapy with albuterol MDI  Daily zinc and vitamin C  As needed antitussives as necessary  Performing serial CRP, procalcitonin, D-dimer daily  Admitting patient to COVID-19 unit  Active Problems:   Acute respiratory failure with hypoxia (HCC)   Please see assessment and plan above    Thrombocytopenia (HCC)   Notable thrombocytopenia, likely secondary to acute infection  No clinical evidence of bleeding  Monitoring platelet counts with serial CBCs    Essential hypertension   Holding home regimen of hydrochlorothiazide, continuing home regimen of lisinopril for now  As needed intravenous antihypertensives for markedly elevated blood pressures.    GERD without esophagitis  Patient has reported history of gastroesophageal reflux disease  Placing  patient on daily Protonix    Code Status:  Full code Family Communication: deferred   Status is: Observation  The patient remains OBS appropriate and will d/c before 2 midnights.  Dispo: The patient is from: Home              Anticipated d/c is to: Home              Anticipated d/c date is: 2 days              Patient currently is not medically stable to d/c.        Marinda ElkGeorge J Humna Moorehouse MD [facility] Pager [phone]  If 7PM-7AM, please contact night-coverage [email or web address] Use universal [place] password for that web site. If you do not have the password, please call the hospital operator.  09/09/2020, 5:59 AM

## 2020-09-09 NOTE — ED Notes (Signed)
Patient states from lying to sitting he was only slightly dizzy. From sitting to standing he felt fine. Once he started walking in the room after orthostatics he started feeling dizzy. Oxygen dropped to 79% RA. After two minutes of rest patient returned to 88-91% on 4L O2.

## 2020-09-09 NOTE — ED Notes (Signed)
Date and time results received: 09/09/20 2:08 AM  COVID +  Name of Provider Notified: N/A; provider already aware

## 2020-09-09 NOTE — ED Provider Notes (Signed)
[facility] EMERGENCY DEPARTMENT Provider Note   CSN: [id number] Arrival date & time: 09/08/20  1851     History Chief Complaint  Patient presents with  . Covid Positive    Seth Garcia is a 54 y.o. male.  54 year old male who was diagnosed with Covid yesterday after having been symptomatic for about 4 days.  Patient states that his wife and both his daughters had Covid but they were vaccinated.  He was way to get his vaccination after his work season was over secondary to fear of side effects making her miss jobs.  Patient states that he started having fever, body aches and malaise on Thursday.  Worsening dyspnea on exertion as well.  Has some oxygen saturations in the high 80s at home.  His wife is a Engineer, civil (consulting) has been taking them.  Eating and drinking okay but not a significant appetite.  Went to urgent care yesterday where he was diagnosed and they sent him here for further evaluation.  Patient states his main issue is he just feels very weak and slightly dizzy when he stands up and moves around.    Past Medical History:  Diagnosis Date  . Elbow pain   . GERD (gastroesophageal reflux disease)   . Hypertension   . Knee pain     There are no problems to display for this patient.   Past Surgical History:  Procedure Laterality Date  . NO PAST SURGERIES         Family History  Problem Relation Age of Onset  . Heart Problems Mother   . Cancer Mother        THROAT  . Diabetes Mother   . Hypertension Mother   . Heart attack Father   . Cancer Father        SKIN  . Hypertension Father   . Diabetes Father   . Healthy Brother   . Healthy Brother   . Healthy Brother   . Healthy Brother   . Healthy Daughter   . Healthy Daughter     Social History   Tobacco Use  . Smoking status: Never Smoker  . Smokeless tobacco: Never Used  Substance Use Topics  . Alcohol use: No  . Drug use: No    Home Medications Prior to Admission medications     Medication Sig Start Date End Date Taking? Authorizing Provider  aspirin 81 MG tablet Take 81 mg by mouth daily.    [provider]  EPINEPHrine 0.15 MG/0.15ML IJ injection Inject 0.15 mg into the muscle as needed for anaphylaxis.    [provider]  hydrochlorothiazide (HYDRODIURIL) 25 MG tablet Take 25 mg by mouth daily.    [provider]  lisinopril (ZESTRIL) 20 MG tablet Take 20 mg by mouth daily.    [provider]    Allergies    Patient has no known allergies.  Review of Systems   Review of Systems  All other systems reviewed and are negative.   Physical Exam Updated Vital Signs BP (!) 145/84   Pulse 89   Temp 99 F (37.2 C) (Oral)   Resp 20   Ht 6\' 3"  (1.905 m)   Wt 122.5 kg   SpO2 92%   BMI 33.75 kg/m   Physical Exam Vitals and nursing note reviewed.  Constitutional:      Appearance: He is well-developed.  HENT:     Head: Normocephalic and atraumatic.     Nose: No congestion or rhinorrhea.  Mouth/Throat:     Mouth: Mucous membranes are moist.     Pharynx: Oropharynx is clear.  Eyes:     Pupils: Pupils are equal, round, and reactive to light.  Cardiovascular:     Rate and Rhythm: Normal rate.  Pulmonary:     Effort: Pulmonary effort is normal. No respiratory distress.  Abdominal:     General: Abdomen is flat. There is no distension.  Musculoskeletal:        General: Normal range of motion.     Cervical back: Normal range of motion.  Skin:    General: Skin is warm and dry.  Neurological:     General: No focal deficit present.     Mental Status: He is alert.     ED Results / Procedures / Treatments   Labs (all labs ordered are listed, but only abnormal results are displayed) Labs Reviewed  CBC WITH DIFFERENTIAL/PLATELET - Abnormal; Notable for the following components:      Result Value   Platelets 126 (*)    Lymphs Abs 0.6 (*)    All other components within normal limits  COMPREHENSIVE METABOLIC PANEL -  Abnormal; Notable for the following components:   Glucose, Bld 137 (*)    Calcium 8.8 (*)    Albumin 3.4 (*)    AST 50 (*)    ALT 51 (*)    All other components within normal limits  URINALYSIS, ROUTINE W REFLEX MICROSCOPIC - Abnormal; Notable for the following components:   Color, Urine AMBER (*)    Specific Gravity, Urine 1.033 (*)    Protein, ur 100 (*)    All other components within normal limits  CULTURE, BLOOD (ROUTINE X 2)  CULTURE, BLOOD (ROUTINE X 2)  RESP PANEL BY RT-PCR (FLU A&B, COVID) ARPGX2  LACTIC ACID, PLASMA    EKG None  Radiology DG Chest Portable 1 View  Result Date: 09/08/2020 CLINICAL DATA:  Shortness of breath. EXAM: PORTABLE CHEST 1 VIEW COMPARISON:  10/24/2008 FINDINGS: The heart size and mediastinal contours are within normal limits. Diffuse interstitial prominence appears increased since previous study in 2010. This may be due to worsening chronic interstitial changes, although interstitial edema or pneumonitis cannot be excluded. No evidence of pulmonary airspace disease or pleural effusion. IMPRESSION: Increased diffuse interstitial prominence since previous study in 2010. This may be due to worsening chronic interstitial changes, although interstitial edema or pneumonitis cannot be excluded. Electronically Signed   By: Danae Orleans M.D.   On: 09/08/2020 20:11    Procedures .Critical Care Performed by: Marily Memos, MD Authorized by: Marily Memos, MD   Critical care provider statement:    Critical care time (minutes):  45   Critical care was necessary to treat or prevent imminent or life-threatening deterioration of the following conditions:  Respiratory failure   Critical care was time spent personally by me on the following activities:  Discussions with consultants, evaluation of patient's response to treatment, examination of patient, ordering and performing treatments and interventions, ordering and review of laboratory studies, ordering and  review of radiographic studies, pulse oximetry, re-evaluation of patient's condition, obtaining history from patient or surrogate and review of old charts   (including critical care time)  Medications Ordered in ED Medications  0.9 %  sodium chloride infusion (0 mLs Intravenous Hold 09/08/20 2341)  diphenhydrAMINE (BENADRYL) injection 50 mg (has no administration in time range)  famotidine (PEPCID) IVPB 20 mg premix (has no administration in time range)  methylPREDNISolone sodium succinate (SOLU-MEDROL) 125  mg/2 mL injection 125 mg (has no administration in time range)  albuterol (VENTOLIN HFA) 108 (90 Base) MCG/ACT inhaler 2 puff (has no administration in time range)  EPINEPHrine (EPI-PEN) injection 0.3 mg (has no administration in time range)  sodium chloride 0.9 % bolus 1,000 mL (has no administration in time range)  bamlanivimab 700 mg, etesevimab 1,400 mg in sodium chloride 0.9 % 160 mL IVPB (has no administration in time range)  acetaminophen (TYLENOL) tablet 1,000 mg (1,000 mg Oral Given 09/09/20 0003)    ED Course  I have reviewed the triage vital signs and the nursing notes.  Pertinent labs & imaging results that were available during my care of the patient were reviewed by me and considered in my medical decision making (see chart for details).    MDM Rules/Calculators/A&P                         Likely Covid pneumonitis.  Borderline oxygen saturations however not really dyspneic.  Probably slightly dehydrated with the fever and the tachypnea.  We will go ahead and initiate monoclonal antibodies here in the ER and some fluids.  Will evaluate for discharge at that time. Apparently requiring more and more oxygen. sats 90% on 4L on reeval. Will probably benefit from admission. Will consult.  Discussed with Dr. Leafy Half. Recommends remdesivir and decadron, ordered.   Final Clinical Impression(s) / ED Diagnoses Final diagnoses:  Hypoxia  COVID-19    Rx / DC Orders ED Discharge  Orders    None       Eurika Sandy, Barbara Cower, MD 09/09/20 [phone]

## 2020-09-09 NOTE — Progress Notes (Signed)
PROGRESS NOTE                                                                             PROGRESS NOTE                                                                                                                                                                                                             Patient Demographics:    Seth Garcia, is a 54 y.o. male, DOB - 09/27/1966, [id number]  Outpatient Primary MD for the patient is [name], MD    LOS - 0  Admit date - 09/08/2020    Chief Complaint  Patient presents with  . Covid Positive       Brief Narrative    This is a no charge note as patient was seen and admitted earlier today by Dr. Leafy Half, chart, imaging and labs were reviewed, patient was seen and examined.  54 year old male with past medical history of hypertension who presents to [facility] emergency department with shortness of breath fever and weakness.  Patient explains that on Thanksgiving day he and several of his family members began to develop generalized weakness and fatigue.  Patient states that over the next several days his symptoms continued to progressively worsen.  This became associated with shortness of breath, initially mild in intensity but progressively becoming more more severe.  Shortness of breath became severe in intensity, worse with exertion and improved with rest.  As shortness of breath worsened, patient also developed an associated cough, severe in intensity and dry without sputum production.  Patient also developed extremely poor appetite.  Patient states that over the span of time he also began to exhibit fevers as high as nearly 41 F.  Patient denies any changes in taste, smell or any evidence of diarrhea.  Of note, patient is not vaccinated for COVID-19.  Patient symptoms continued to progressively worsen until he eventually presented to [facility] emergency department for  evaluation.  Upon evaluation in the emergency department patient was  found to be hypoxic with oxygen saturations as low as the 70s.  Patient was placed on 4 L of supplemental oxygen to maintain oxygen saturations in the low 90s.  Emergency department provider initially felt the patient may be able to potentially go home and was provided with a monoclonal antibody infusion but throughout the ED course the patient continued to exhibit substantial symptoms and oxygen requirement and therefore the hospitalist group has now been called to assess the patient for mission the hospital.   Subjective:    Seth Garcia today does report shortness of breath, cough, generalized weakness and fatigue.     Assessment  & Plan :    Principal Problem:   COVID-19 virus infection Active Problems:   Acute respiratory failure with hypoxia (HCC)   Thrombocytopenia (HCC)   Essential hypertension   GERD without esophagitis  Acute Hypoxic Resp. Failure due to Acute Covid 19 Viral Pneumonitis during the ongoing 2020 Covid 19 Pandemic  -He is unvaccinated. -He received monoclonal antibody in ED. -Continue with IV steroids and Remdesivir -He remains hypoxic on 4 L nasal cannula, continue with oxygen.  Encouraged the patient to sit up in chair in the daytime use I-S and flutter valve for pulmonary toiletry and then prone in bed when at night.  Will advance activity and titrate down oxygen as possible.  Actemra/Baricitinib  off label use - patient was told that if COVID-19 pneumonitis gets worse we might potentially use Actemra off label, patient denies any known history of active diverticulitis, tuberculosis or hepatitis, understands the risks and benefits and wants to proceed with Actemra treatment if required.     SpO2: (!) 89 % O2 Flow Rate (L/min): 4 L/min  Recent Labs  Lab 09/08/20 1956 09/09/20 0029 09/09/20 0718  WBC 4.5  --  4.9  PLT 126*  --  125*  DDIMER  --   --  0.63*  PROCALCITON  --   --   <0.10  AST 50*  --  49*  ALT 51*  --  46*  ALKPHOS 45  --  39  BILITOT 1.2  --  0.7  ALBUMIN 3.4*  --  3.1*  LATICACIDVEN 1.7  --   --   SARSCOV2NAA  --  POSITIVE*  --         Thrombocytopenia (HCC)   Notable thrombocytopenia, likely secondary to acute infection  No clinical evidence of bleeding  Monitoring platelet counts with serial CBCs    Essential hypertension   Holding home regimen of hydrochlorothiazide, continuing home regimen of lisinopril for now  As needed intravenous antihypertensives for markedly elevated blood pressures.    GERD without esophagitis  Patient has reported history of gastroesophageal reflux disease Placing patient on daily Protonix        Condition - Extremely Guarded  Code Status :  full  Consults  :  none  Procedures  :  none  Disposition Plan  :    Status is: Observation  The patient will require care spanning > 2 midnights and should be moved to inpatient because: Hemodynamically unstable and IV treatments appropriate due to intensity of illness or inability to take PO  Dispo: The patient is from: Home              Anticipated d/c is to: Home              Anticipated d/c date is: 3 days              Patient  currently is not medically stable to d/c.      DVT Prophylaxis  :  Lovenox   Lab Results  Component Value Date   PLT 125 (L) 09/09/2020    Diet :  Diet Order            Diet 2 gram sodium Room service appropriate? Yes; Fluid consistency: Thin  Diet effective now                  Inpatient Medications  Scheduled Meds: . vitamin C  500 mg Oral Daily  . aspirin  81 mg Oral Daily  . [START ON 09/10/2020] dexamethasone (DECADRON) injection  6 mg Intravenous Q24H  . enoxaparin (LOVENOX) injection  60 mg Subcutaneous Q24H  . lisinopril  20 mg Oral Daily  . pantoprazole  40 mg Oral Daily  . zinc sulfate  220 mg Oral Daily   Continuous Infusions: . famotidine (PEPCID) IV    . lactated ringers 125  mL/hr at 09/09/20 1404  . [START ON 09/10/2020] remdesivir 100 mg in NS 100 mL     PRN Meds:.acetaminophen, albuterol, diphenhydrAMINE, EPINEPHrine, famotidine (PEPCID) IV, guaiFENesin-dextromethorphan, hydrALAZINE, methylPREDNISolone (SOLU-MEDROL) injection, ondansetron **OR** ondansetron (ZOFRAN) IV, polyethylene glycol  Antibiotics  :    Anti-infectives (From admission, onward)   Start     Dose/Rate Route Frequency Ordered Stop   09/10/20 1000  remdesivir 100 mg in sodium chloride 0.9 % 100 mL IVPB       "Followed by" Linked Group Details   100 mg 200 mL/hr over 30 Minutes Intravenous Daily 09/09/20 0318 09/14/20 0959   09/09/20 0500  remdesivir 200 mg in sodium chloride 0.9% 250 mL IVPB       "Followed by" Linked Group Details   200 mg 580 mL/hr over 30 Minutes Intravenous Once 09/09/20 0318 09/09/20 0518        Persis Graffius M.D on 09/09/2020 at 2:11 PM  To page go to [email or web address]  [facility] -  Office  [phone]     Objective:   Vitals:   09/09/20 1200 09/09/20 1300 09/09/20 1315 09/09/20 1330  BP: 131/74 136/82 (!) 142/79 140/86  Pulse: (!) 114 (!) 101 (!) 106 (!) 111  Resp: (!) 36 (!) 38 (!) 35 17  Temp:      TempSrc:      SpO2: 91% (!) 89% (!) 88% (!) 89%  Weight:      Height:        Wt Readings from Last 3 Encounters:  09/08/20 122.5 kg  11/01/19 126.5 kg  03/16/16 116.8 kg     Intake/Output Summary (Last 24 hours) at 09/09/2020 1411 Last data filed at 09/09/2020 0831 Gross per 24 hour  Intake --  Output 600 ml  Net -600 ml     Physical Exam  Awake Alert, No new F.N deficits, Normal affect Symmetrical Chest wall movement, Good air movement bilaterally, CTAB RRR,No Gallops,Rubs or new Murmurs, No Parasternal Heave +ve B.Sounds, Abd Soft, No tenderness,No rebound - guarding or rigidity. No Cyanosis, Clubbing or edema, No new Rash or bruise      Data Review:    CBC Recent Labs  Lab 09/08/20 1956 09/09/20 0718  WBC 4.5 4.9   HGB 15.7 14.7  HCT 46.5 44.9  PLT 126* 125*  MCV 89.1 89.4  MCH 30.1 29.3  MCHC 33.8 32.7  RDW 13.3 13.3  LYMPHSABS 0.6* 0.5*  MONOABS 0.3 0.2  EOSABS 0.0 0.0  BASOSABS 0.0 0.0    Recent Labs  Lab 09/08/20 1956 09/09/20 0718  NA 138 137  K 3.5 3.9  CL 101 102  CO2 24 21*  GLUCOSE 137* 156*  BUN 15 14  CREATININE 0.91 0.84  CALCIUM 8.8* 8.6*  AST 50* 49*  ALT 51* 46*  ALKPHOS 45 39  BILITOT 1.2 0.7  ALBUMIN 3.4* 3.1*  DDIMER  --  0.63*  PROCALCITON  --  <0.10  LATICACIDVEN 1.7  --     ------------------------------------------------------------------------------------------------------------------ No results for input(s): CHOL, HDL, LDLCALC, TRIG, CHOLHDL, LDLDIRECT in the last 72 hours.  No results found for: HGBA1C ------------------------------------------------------------------------------------------------------------------ No results for input(s): TSH, T4TOTAL, T3FREE, THYROIDAB in the last 72 hours.  Invalid input(s): FREET3  Cardiac Enzymes No results for input(s): CKMB, TROPONINI, MYOGLOBIN in the last 168 hours.  Invalid input(s): CK ------------------------------------------------------------------------------------------------------------------ No results found for: BNP  Micro Results Recent Results (from the past 240 hour(s))  Resp Panel by RT-PCR (Flu A&B, Covid) Nasopharyngeal Swab     Status: Abnormal   Collection Time: 09/09/20 12:29 AM   Specimen: Nasopharyngeal Swab; Nasopharyngeal(NP) swabs in vial transport medium  Result Value Ref Range Status   SARS Coronavirus 2 by RT PCR POSITIVE (A) NEGATIVE Final    Comment: RESULT CALLED TO, READ BACK BY AND VERIFIED WITH: Sherryl Manges RN 09/09/20 0207 JDW    Influenza A by PCR NEGATIVE NEGATIVE Final   Influenza B by PCR NEGATIVE NEGATIVE Final    Comment: Performed at [facility], [address], [place], [place] [address]    Radiology Reports DG Chest Portable 1 View  Result  Date: 09/08/2020 CLINICAL DATA:  Shortness of breath. EXAM: PORTABLE CHEST 1 VIEW COMPARISON:  10/24/2008 FINDINGS: The heart size and mediastinal contours are within normal limits. Diffuse interstitial prominence appears increased since previous study in 2010. This may be due to worsening chronic interstitial changes, although interstitial edema or pneumonitis cannot be excluded. No evidence of pulmonary airspace disease or pleural effusion. IMPRESSION: Increased diffuse interstitial prominence since previous study in 2010. This may be due to worsening chronic interstitial changes, although interstitial edema or pneumonitis cannot be excluded. Electronically Signed   By: Danae Orleans M.D.   On: 09/08/2020 20:11

## 2020-09-10 DIAGNOSIS — Z8249 Family history of ischemic heart disease and other diseases of the circulatory system: Secondary | ICD-10-CM | POA: Diagnosis not present

## 2020-09-10 DIAGNOSIS — R739 Hyperglycemia, unspecified: Secondary | ICD-10-CM | POA: Diagnosis not present

## 2020-09-10 DIAGNOSIS — Z7982 Long term (current) use of aspirin: Secondary | ICD-10-CM | POA: Diagnosis not present

## 2020-09-10 DIAGNOSIS — Z79899 Other long term (current) drug therapy: Secondary | ICD-10-CM | POA: Diagnosis not present

## 2020-09-10 DIAGNOSIS — J9600 Acute respiratory failure, unspecified whether with hypoxia or hypercapnia: Secondary | ICD-10-CM | POA: Diagnosis not present

## 2020-09-10 DIAGNOSIS — U071 COVID-19: Secondary | ICD-10-CM | POA: Diagnosis present

## 2020-09-10 DIAGNOSIS — I10 Essential (primary) hypertension: Secondary | ICD-10-CM | POA: Diagnosis present

## 2020-09-10 DIAGNOSIS — D6959 Other secondary thrombocytopenia: Secondary | ICD-10-CM | POA: Diagnosis present

## 2020-09-10 DIAGNOSIS — J1282 Pneumonia due to coronavirus disease 2019: Secondary | ICD-10-CM | POA: Diagnosis present

## 2020-09-10 DIAGNOSIS — T380X5A Adverse effect of glucocorticoids and synthetic analogues, initial encounter: Secondary | ICD-10-CM | POA: Diagnosis not present

## 2020-09-10 DIAGNOSIS — Z23 Encounter for immunization: Secondary | ICD-10-CM | POA: Diagnosis not present

## 2020-09-10 DIAGNOSIS — K219 Gastro-esophageal reflux disease without esophagitis: Secondary | ICD-10-CM | POA: Diagnosis present

## 2020-09-10 DIAGNOSIS — J9601 Acute respiratory failure with hypoxia: Secondary | ICD-10-CM | POA: Diagnosis present

## 2020-09-10 DIAGNOSIS — R0902 Hypoxemia: Secondary | ICD-10-CM | POA: Diagnosis present

## 2020-09-10 LAB — CBC WITH DIFFERENTIAL/PLATELET
Abs Immature Granulocytes: 0.05 10*3/uL (ref 0.00–0.07)
Basophils Absolute: 0 10*3/uL (ref 0.0–0.1)
Basophils Relative: 0 %
Eosinophils Absolute: 0 10*3/uL (ref 0.0–0.5)
Eosinophils Relative: 0 %
HCT: 42.3 % (ref 39.0–52.0)
Hemoglobin: 14.4 g/dL (ref 13.0–17.0)
Immature Granulocytes: 1 %
Lymphocytes Relative: 16 %
Lymphs Abs: 0.8 10*3/uL (ref 0.7–4.0)
MCH: 30 pg (ref 26.0–34.0)
MCHC: 34 g/dL (ref 30.0–36.0)
MCV: 88.1 fL (ref 80.0–100.0)
Monocytes Absolute: 0.5 10*3/uL (ref 0.1–1.0)
Monocytes Relative: 9 %
Neutro Abs: 3.6 10*3/uL (ref 1.7–7.7)
Neutrophils Relative %: 74 %
Platelets: 159 10*3/uL (ref 150–400)
RBC: 4.8 MIL/uL (ref 4.22–5.81)
RDW: 13.2 % (ref 11.5–15.5)
WBC: 4.9 10*3/uL (ref 4.0–10.5)
nRBC: 0 % (ref 0.0–0.2)

## 2020-09-10 LAB — COMPREHENSIVE METABOLIC PANEL
ALT: 41 U/L (ref 0–44)
AST: 43 U/L — ABNORMAL HIGH (ref 15–41)
Albumin: 3 g/dL — ABNORMAL LOW (ref 3.5–5.0)
Alkaline Phosphatase: 40 U/L (ref 38–126)
Anion gap: 13 (ref 5–15)
BUN: 17 mg/dL (ref 6–20)
CO2: 26 mmol/L (ref 22–32)
Calcium: 8.9 mg/dL (ref 8.9–10.3)
Chloride: 101 mmol/L (ref 98–111)
Creatinine, Ser: 0.86 mg/dL (ref 0.61–1.24)
GFR, Estimated: >60 mL/min (ref >60)
Glucose, Bld: 117 mg/dL — ABNORMAL HIGH (ref 70–99)
Potassium: 4.1 mmol/L (ref 3.5–5.1)
Sodium: 140 mmol/L (ref 135–145)
Total Bilirubin: 0.8 mg/dL (ref 0.3–1.2)
Total Protein: 7 g/dL (ref 6.5–8.1)

## 2020-09-10 LAB — MAGNESIUM: Magnesium: 2.3 mg/dL (ref 1.7–2.4)

## 2020-09-10 LAB — C-REACTIVE PROTEIN: CRP: 9.4 mg/dL — ABNORMAL HIGH (ref <1.0)

## 2020-09-10 LAB — D-DIMER, QUANTITATIVE: D-Dimer, Quant: 0.65 ug/mL-FEU — ABNORMAL HIGH (ref 0.00–0.50)

## 2020-09-10 MED ORDER — METHYLPREDNISOLONE SODIUM SUCC 125 MG IJ SOLR
60.0000 mg | Freq: Three times a day (TID) | INTRAMUSCULAR | Status: DC
  Administered 2020-09-10 – 2020-09-14 (×13): 60 mg via INTRAVENOUS
  Filled 2020-09-10 (×13): qty 2

## 2020-09-10 MED ORDER — HYDROCHLOROTHIAZIDE 12.5 MG PO CAPS
12.5000 mg | ORAL_CAPSULE | Freq: Every day | ORAL | Status: DC
  Administered 2020-09-10: 12.5 mg via ORAL

## 2020-09-10 MED ORDER — TOCILIZUMAB 400 MG/20ML IV SOLN
800.0000 mg | Freq: Once | INTRAVENOUS | Status: AC
  Administered 2020-09-10: 800 mg via INTRAVENOUS
  Filled 2020-09-10: qty 40

## 2020-09-10 MED ORDER — INFLUENZA VAC SPLIT QUAD 0.5 ML IM SUSY
0.5000 mL | PREFILLED_SYRINGE | INTRAMUSCULAR | Status: AC | PRN
  Administered 2020-09-14: 0.5 mL via INTRAMUSCULAR
  Filled 2020-09-10: qty 0.5

## 2020-09-10 MED ORDER — BARICITINIB 2 MG PO TABS: 4.0000 mg | ORAL_TABLET | Freq: Every day | ORAL | Status: DC

## 2020-09-10 MED ORDER — POLYETHYLENE GLYCOL 3350 17 G PO PACK
17.0000 g | PACK | Freq: Every day | ORAL | Status: AC
  Filled 2020-09-10: qty 1

## 2020-09-10 MED ORDER — TRAZODONE HCL 50 MG PO TABS
50.0000 mg | ORAL_TABLET | Freq: Every evening | ORAL | Status: DC | PRN
  Administered 2020-09-10: 50 mg via ORAL
  Filled 2020-09-10: qty 1

## 2020-09-10 NOTE — Progress Notes (Signed)
SATURATION QUALIFICATIONS: (This note is used to comply with regulatory documentation for home oxygen)  Patient Saturations on Room Air at Rest = 83%  Patient Saturations on Room Air while Ambulating = 79%  Patient Saturations on 15 Liters of oxygen while Ambulating = 90%  Please briefly explain why patient needs home oxygen:

## 2020-09-10 NOTE — Progress Notes (Signed)
PROGRESS NOTE                                                                             PROGRESS NOTE                                                                                                                                                                                                             Patient Demographics:    Seth Garcia, is a 54 y.o. male, DOB - [birth year], [id number]  Outpatient Primary MD for the patient is [name], MD    LOS - 0  Admit date - 09/08/2020    Chief Complaint  Patient presents with  . Covid Positive       Brief Narrative    54 year old male with past medical history of hypertension who presents to [facility] emergency department with shortness of breath fever and weakness.  Patient explains that on Thanksgiving day he and several of his family members began to develop generalized weakness and fatigue.  Patient states that over the next several days his symptoms continued to progressively worsen.  This became associated with shortness of breath, initially mild in intensity but progressively becoming more more severe.  Shortness of breath became severe in intensity, worse with exertion and improved with rest.  As shortness of breath worsened, patient also developed an associated cough, severe in intensity and dry without sputum production.  Patient also developed extremely poor appetite.  Patient states that over the span of time he also began to exhibit fevers as high as nearly 21 F.  Patient denies any changes in taste, smell or any evidence of diarrhea.  Of note, patient is not vaccinated for COVID-19.  Patient symptoms continued to progressively worsen until he eventually presented to [facility] emergency department for evaluation.  Upon evaluation in the emergency department patient was found to be hypoxic with oxygen saturations as low as the 70s.  Patient was placed on 4 L  of supplemental oxygen to maintain oxygen saturations in the low  90s.  Emergency department provider initially felt the patient may be able to potentially go home and was provided with a monoclonal antibody infusion but throughout the ED course the patient continued to exhibit substantial symptoms and oxygen requirement and therefore the hospitalist group has now been called to assess the patient for mission the hospital.   Subjective:    Seth Garcia today reports cough, constipation, report dyspnea mainly with activity, generalized weakness and fatigue.  .   Assessment  & Plan :    Principal Problem:   COVID-19 virus infection Active Problems:   Acute respiratory failure with hypoxia (HCC)   Thrombocytopenia (HCC)   Essential hypertension   GERD without esophagitis  Acute Hypoxic Resp. Failure due to Acute Covid 19 Viral Pneumonitis during the ongoing 2020 Covid 19 Pandemic  -He is unvaccinated. -He received monoclonal antibody in ED. -Continue with IV steroids , he is currently on IV Solu-Medrol . -Continue with IV remdesivir . -He is with increased oxygen requirement this morning, saturating 86% on 6 L high flow nasal cannula, he will receive Actemra today (Actemra  use - patient was told that if COVID-19 pneumonitis gets worse we might potentially use Actemra off label, patient denies any known history of active diverticulitis, tuberculosis or hepatitis, understands the risks and benefits and wants to proceed with Actemra treatment ) - Encouraged the patient to sit up in chair in the daytime use I-S and flutter valve for pulmonary toiletry and then prone in bed when at night.  Will advance activity and titrate down oxygen as possible.   SpO2: 93 % O2 Flow Rate (L/min): 10 L/min  Recent Labs  Lab 09/08/20 1956 09/09/20 0029 09/09/20 0718 09/10/20 0018  WBC 4.5  --  4.9 4.9  PLT 126*  --  125* 159  CRP  --   --   --  9.4*  DDIMER  --   --  0.63* 0.65*  PROCALCITON  --   --   <0.10  --   AST 50*  --  49* 43*  ALT 51*  --  46* 41  ALKPHOS 45  --  39 40  BILITOT 1.2  --  0.7 0.8  ALBUMIN 3.4*  --  3.1* 3.0*  LATICACIDVEN 1.7  --   --   --   SARSCOV2NAA  --  POSITIVE*  --   --      Thrombocytopenia  -Due to Covid, improving  Essential hypertension -Started to increase on lisinopril, will resume on home dose hydrochlorothiazide  GERD -Continue with PPI     Condition - Extremely Guarded  Family communication: D/W wife by phone.  Code Status :  full  Consults  :  none  Procedures  :  none  Disposition Plan  :    Status is: Observation  The patient will require care spanning > 2 midnights and should be moved to inpatient because: Hemodynamically unstable and IV treatments appropriate due to intensity of illness or inability to take PO  Dispo: The patient is from: Home              Anticipated d/c is to: Home              Anticipated d/c date is: 3 days              Patient currently is not medically stable to d/c.      DVT Prophylaxis  :  Lovenox   Lab Results  Component Value Date  PLT 159 09/10/2020    Diet :  Diet Order            Diet 2 gram sodium Room service appropriate? Yes; Fluid consistency: Thin  Diet effective now                  Inpatient Medications  Scheduled Meds: . vitamin C  500 mg Oral Daily  . aspirin  81 mg Oral Daily  . enoxaparin (LOVENOX) injection  60 mg Subcutaneous Q24H  . lisinopril  20 mg Oral Daily  . mouth rinse  15 mL Mouth Rinse BID  . methylPREDNISolone (SOLU-MEDROL) injection  60 mg Intravenous Q8H  . pantoprazole  40 mg Oral Daily  . polyethylene glycol  17 g Oral Daily  . zinc sulfate  220 mg Oral Daily   Continuous Infusions: . remdesivir 100 mg in NS 100 mL 100 mg (09/10/20 0914)  . tocilizumab (ACTEMRA) - non-COVID treatment 800 mg (09/10/20 0959)   PRN Meds:.acetaminophen, albuterol, guaiFENesin-dextromethorphan, hydrALAZINE, ondansetron **OR** ondansetron (ZOFRAN) IV,  polyethylene glycol, traZODone  Antibiotics  :    Anti-infectives (From admission, onward)   Start     Dose/Rate Route Frequency Ordered Stop   09/10/20 1000  remdesivir 100 mg in sodium chloride 0.9 % 100 mL IVPB       "Followed by" Linked Group Details   100 mg 200 mL/hr over 30 Minutes Intravenous Daily 09/09/20 0318 09/14/20 0959   09/09/20 0500  remdesivir 200 mg in sodium chloride 0.9% 250 mL IVPB       "Followed by" Linked Group Details   200 mg 580 mL/hr over 30 Minutes Intravenous Once 09/09/20 0318 09/09/20 0518        Khaleb Broz M.D on 09/10/2020 at 10:56 AM  To page go to [email or web address]  [facility] -  Office  [phone]     Objective:   Vitals:   09/09/20 1920 09/09/20 1930 09/09/20 2022 09/10/20 0903  BP: 126/77  (!) 144/82   Pulse: 86 98 98   Resp: 20  18   Temp:  99.1 F (37.3 C) 99.1 F (37.3 C)   TempSrc:  Axillary Axillary   SpO2:   94% 93%  Weight:      Height:        Wt Readings from Last 3 Encounters:  09/08/20 122.5 kg  11/01/19 126.5 kg  03/16/16 116.8 kg     Intake/Output Summary (Last 24 hours) at 09/10/2020 1056 Last data filed at 09/10/2020 0600 Gross per 24 hour  Intake --  Output 500 ml  Net -500 ml     Physical Exam  Awake Alert, Oriented X 3, No new F.N deficits, Normal affect Symmetrical Chest wall movement, Good air movement bilaterally, CTAB RRR,No Gallops,Rubs or new Murmurs, No Parasternal Heave +ve B.Sounds, Abd Soft, No tenderness, No rebound - guarding or rigidity. No Cyanosis, Clubbing or edema, No new Rash or bruise      Data Review:    CBC Recent Labs  Lab 09/08/20 1956 09/09/20 0718 09/10/20 0018  WBC 4.5 4.9 4.9  HGB 15.7 14.7 14.4  HCT 46.5 44.9 42.3  PLT 126* 125* 159  MCV 89.1 89.4 88.1  MCH 30.1 29.3 30.0  MCHC 33.8 32.7 34.0  RDW 13.3 13.3 13.2  LYMPHSABS 0.6* 0.5* 0.8  MONOABS 0.3 0.2 0.5  EOSABS 0.0 0.0 0.0  BASOSABS 0.0 0.0 0.0    Recent Labs  Lab 09/08/20 1956  09/09/20 0718 09/10/20 0018  NA 138 137  140  K 3.5 3.9 4.1  CL 101 102 101  CO2 24 21* 26  GLUCOSE 137* 156* 117*  BUN 15 14 17   CREATININE 0.91 0.84 0.86  CALCIUM 8.8* 8.6* 8.9  AST 50* 49* 43*  ALT 51* 46* 41  ALKPHOS 45 39 40  BILITOT 1.2 0.7 0.8  ALBUMIN 3.4* 3.1* 3.0*  MG  --   --  2.3  CRP  --   --  9.4*  DDIMER  --  0.63* 0.65*  PROCALCITON  --  <0.10  --   LATICACIDVEN 1.7  --   --     ------------------------------------------------------------------------------------------------------------------ No results for input(s): CHOL, HDL, LDLCALC, TRIG, CHOLHDL, LDLDIRECT in the last 72 hours.  No results found for: HGBA1C ------------------------------------------------------------------------------------------------------------------ No results for input(s): TSH, T4TOTAL, T3FREE, THYROIDAB in the last 72 hours.  Invalid input(s): FREET3  Cardiac Enzymes No results for input(s): CKMB, TROPONINI, MYOGLOBIN in the last 168 hours.  Invalid input(s): CK ------------------------------------------------------------------------------------------------------------------ No results found for: BNP  Micro Results Recent Results (from the past 240 hour(s))  Blood culture (routine x 2)     Status: None (Preliminary result)   Collection Time: 09/08/20  7:56 PM   Specimen: BLOOD RIGHT ARM  Result Value Ref Range Status   Specimen Description BLOOD RIGHT ARM  Final   Special Requests   Final    BOTTLES DRAWN AEROBIC AND ANAEROBIC Blood Culture adequate volume   Culture   Final    NO GROWTH 2 DAYS Performed at [facility], [address] [place], [place]    Report Status PENDING  Incomplete  Blood culture (routine x 2)     Status: None (Preliminary result)   Collection Time: 09/08/20  7:56 PM   Specimen: BLOOD LEFT ARM  Result Value Ref Range Status   Specimen Description BLOOD LEFT ARM  Final   Special Requests   Final    BOTTLES DRAWN AEROBIC ONLY  Blood Culture adequate volume   Culture   Final    NO GROWTH 2 DAYS Performed at [facility], [address] [place], [place]    Report Status PENDING  Incomplete  Resp Panel by RT-PCR (Flu A&B, Covid) Nasopharyngeal Swab     Status: Abnormal   Collection Time: 09/09/20 12:29 AM   Specimen: Nasopharyngeal Swab; Nasopharyngeal(NP) swabs in vial transport medium  Result Value Ref Range Status   SARS Coronavirus 2 by RT PCR POSITIVE (A) NEGATIVE Final    Comment: RESULT CALLED TO, READ BACK BY AND VERIFIED WITH: 14/01/21 RN 09/09/20 0207 JDW    Influenza A by PCR NEGATIVE NEGATIVE Final   Influenza B by PCR NEGATIVE NEGATIVE Final    Comment: Performed at [facility], [address], [place], [place]    Radiology Reports DG Chest Portable 1 View  Result Date: 09/08/2020 CLINICAL DATA:  Shortness of breath. EXAM: PORTABLE CHEST 1 VIEW COMPARISON:  10/24/2008 FINDINGS: The heart size and mediastinal contours are within normal limits. Diffuse interstitial prominence appears increased since previous study in 2010. This may be due to worsening chronic interstitial changes, although interstitial edema or pneumonitis cannot be excluded. No evidence of pulmonary airspace disease or pleural effusion. IMPRESSION: Increased diffuse interstitial prominence since previous study in 2010. This may be due to worsening chronic interstitial changes, although interstitial edema or pneumonitis cannot be excluded. Electronically Signed   By: 2011 M.D.   On: 09/08/2020 20:11

## 2020-09-11 DIAGNOSIS — U071 COVID-19: Secondary | ICD-10-CM | POA: Diagnosis not present

## 2020-09-11 DIAGNOSIS — J9600 Acute respiratory failure, unspecified whether with hypoxia or hypercapnia: Secondary | ICD-10-CM | POA: Diagnosis not present

## 2020-09-11 DIAGNOSIS — I10 Essential (primary) hypertension: Secondary | ICD-10-CM | POA: Diagnosis not present

## 2020-09-11 LAB — COMPREHENSIVE METABOLIC PANEL
ALT: 45 U/L — ABNORMAL HIGH (ref 0–44)
AST: 47 U/L — ABNORMAL HIGH (ref 15–41)
Albumin: 3.3 g/dL — ABNORMAL LOW (ref 3.5–5.0)
Alkaline Phosphatase: 42 U/L (ref 38–126)
Anion gap: 16 — ABNORMAL HIGH (ref 5–15)
BUN: 24 mg/dL — ABNORMAL HIGH (ref 6–20)
CO2: 24 mmol/L (ref 22–32)
Calcium: 9.3 mg/dL (ref 8.9–10.3)
Chloride: 99 mmol/L (ref 98–111)
Creatinine, Ser: 0.91 mg/dL (ref 0.61–1.24)
GFR, Estimated: >60 mL/min (ref >60)
Glucose, Bld: 153 mg/dL — ABNORMAL HIGH (ref 70–99)
Potassium: 4 mmol/L (ref 3.5–5.1)
Sodium: 139 mmol/L (ref 135–145)
Total Bilirubin: 0.9 mg/dL (ref 0.3–1.2)
Total Protein: 7.6 g/dL (ref 6.5–8.1)

## 2020-09-11 LAB — CBC WITH DIFFERENTIAL/PLATELET
Abs Immature Granulocytes: 0.12 10*3/uL — ABNORMAL HIGH (ref 0.00–0.07)
Basophils Absolute: 0 10*3/uL (ref 0.0–0.1)
Basophils Relative: 0 %
Eosinophils Absolute: 0 10*3/uL (ref 0.0–0.5)
Eosinophils Relative: 0 %
HCT: 47.3 % (ref 39.0–52.0)
Hemoglobin: 16.3 g/dL (ref 13.0–17.0)
Immature Granulocytes: 3 %
Lymphocytes Relative: 21 %
Lymphs Abs: 1 10*3/uL (ref 0.7–4.0)
MCH: 30.2 pg (ref 26.0–34.0)
MCHC: 34.5 g/dL (ref 30.0–36.0)
MCV: 87.6 fL (ref 80.0–100.0)
Monocytes Absolute: 0.3 10*3/uL (ref 0.1–1.0)
Monocytes Relative: 7 %
Neutro Abs: 3.2 10*3/uL (ref 1.7–7.7)
Neutrophils Relative %: 69 %
Platelets: 216 10*3/uL (ref 150–400)
RBC: 5.4 MIL/uL (ref 4.22–5.81)
RDW: 13.2 % (ref 11.5–15.5)
WBC: 4.6 10*3/uL (ref 4.0–10.5)
nRBC: 0 % (ref 0.0–0.2)

## 2020-09-11 LAB — D-DIMER, QUANTITATIVE: D-Dimer, Quant: 1.91 ug/mL-FEU — ABNORMAL HIGH (ref 0.00–0.50)

## 2020-09-11 LAB — C-REACTIVE PROTEIN: CRP: 4 mg/dL — ABNORMAL HIGH (ref <1.0)

## 2020-09-11 LAB — MAGNESIUM: Magnesium: 2.5 mg/dL — ABNORMAL HIGH (ref 1.7–2.4)

## 2020-09-11 MED ORDER — ZOLPIDEM TARTRATE 5 MG PO TABS
5.0000 mg | ORAL_TABLET | Freq: Every evening | ORAL | Status: DC | PRN
  Administered 2020-09-11 – 2020-09-13 (×3): 5 mg via ORAL
  Filled 2020-09-11 (×3): qty 1

## 2020-09-11 MED ORDER — AMLODIPINE BESYLATE 5 MG PO TABS
5.0000 mg | ORAL_TABLET | Freq: Every day | ORAL | Status: DC
  Administered 2020-09-11 – 2020-09-14 (×4): 5 mg via ORAL
  Filled 2020-09-11 (×4): qty 1

## 2020-09-11 NOTE — Progress Notes (Signed)
PROGRESS NOTE                                                                             PROGRESS NOTE                                                                                                                                                                                                             Patient Demographics:    Seth Garcia, is a 54 y.o. male, DOB - 06-29-1966, [id number]  Outpatient Primary MD for the patient is [name], MD    LOS - 1  Admit date - 09/08/2020    Chief Complaint  Patient presents with  . Covid Positive       Brief Narrative    54 year old male with past medical history of hypertension who presents to [facility] emergency department with shortness of breath fever and weakness. -He was COVID-19 positive, he was given monoclonal antibody in ED, but when he had declined any clinical condition, so admission was requested for acute hypoxic respiratory failure due to COVID-19 of pneumonia, and he is unvaccinated against Covid .    Subjective:    [name] today ports poor night sleep despite receiving trazodone, report dyspnea mainly with activity, still reports cough. .   Assessment  & Plan :    Principal Problem:   COVID-19 virus infection Active Problems:   Acute respiratory failure with hypoxia (HCC)   Thrombocytopenia (HCC)   Essential hypertension   GERD without esophagitis   Acute respiratory failure due to COVID-19 (HCC)  Acute Hypoxic Resp. Failure due to Acute Covid 19 Viral Pneumonitis during the ongoing 2020 Covid 19 Pandemic  -He is unvaccinated. -He received monoclonal antibody in ED. -Continue with IV steroids , he is currently on IV Solu-Medrol . -Continue with IV remdesivir . -Received Actemra 12/2 -This morning he is on 3 to 4 L oxygen, but with activity he required up to 15 L. - Encouraged the patient to sit up in chair in the daytime use I-S  and flutter valve for  pulmonary toiletry and then prone in bed when at night.  Will advance activity and titrate down oxygen as possible. -Continue to trend inflammatory markers closely occluding CRP and D-dimers.   SpO2: 92 % O2 Flow Rate (L/min): 2 L/min  Recent Labs  Lab 09/08/20 1956 09/09/20 0029 09/09/20 0718 09/10/20 0018 09/11/20 0146  WBC 4.5  --  4.9 4.9 4.6  PLT 126*  --  125* 159 216  CRP  --   --   --  9.4* 4.0*  DDIMER  --   --  0.63* 0.65* 1.91*  PROCALCITON  --   --  <0.10  --   --   AST 50*  --  49* 43* 47*  ALT 51*  --  46* 41 45*  ALKPHOS 45  --  39 40 42  BILITOT 1.2  --  0.7 0.8 0.9  ALBUMIN 3.4*  --  3.1* 3.0* 3.3*  LATICACIDVEN 1.7  --   --   --   --   SARSCOV2NAA  --  POSITIVE*  --   --   --      Thrombocytopenia  -Due to Covid, resolved  Transaminitis -Due to Covid, monitor closely  Essential hypertension -Continue with lisinopril, appears to be mildly dry, with increased BUN and hemoglobin, will discontinue hydrochlorothiazide and start on Norvasc instead .  GERD -Continue with PPI     Condition - Extremely Guarded  Family communication: D/W wife by phone.  Code Status :  full  Consults  :  none  Procedures  :  none  Disposition Plan  :    Status is: Observation  The patient will require care spanning > 2 midnights and should be moved to inpatient because: Hemodynamically unstable and IV treatments appropriate due to intensity of illness or inability to take PO  Dispo: The patient is from: Home              Anticipated d/c is to: Home              Anticipated d/c date is: 3 days              Patient currently is not medically stable to d/c.      DVT Prophylaxis  :  Lovenox   Lab Results  Component Value Date   PLT 216 09/11/2020    Diet :  Diet Order            Diet 2 gram sodium Room service appropriate? Yes; Fluid consistency: Thin  Diet effective now                  Inpatient Medications  Scheduled Meds: . amLODipine  5  mg Oral Daily  . vitamin C  500 mg Oral Daily  . aspirin  81 mg Oral Daily  . enoxaparin (LOVENOX) injection  60 mg Subcutaneous Q24H  . lisinopril  20 mg Oral Daily  . mouth rinse  15 mL Mouth Rinse BID  . methylPREDNISolone (SOLU-MEDROL) injection  60 mg Intravenous Q8H  . pantoprazole  40 mg Oral Daily  . polyethylene glycol  17 g Oral Daily  . zinc sulfate  220 mg Oral Daily   Continuous Infusions: . remdesivir 100 mg in NS 100 mL Stopped (09/11/20 0933)   PRN Meds:.acetaminophen, albuterol, guaiFENesin-dextromethorphan, hydrALAZINE, influenza vac split quadrivalent PF, ondansetron **OR** ondansetron (ZOFRAN) IV, polyethylene glycol, traZODone  Antibiotics  :    Anti-infectives (From admission, onward)   Start  Dose/Rate Route Frequency Ordered Stop   09/10/20 1000  remdesivir 100 mg in sodium chloride 0.9 % 100 mL IVPB       "Followed by" Linked Group Details   100 mg 200 mL/hr over 30 Minutes Intravenous Daily 09/09/20 0318 09/14/20 0959   09/09/20 0500  remdesivir 200 mg in sodium chloride 0.9% 250 mL IVPB       "Followed by" Linked Group Details   200 mg 580 mL/hr over 30 Minutes Intravenous Once 09/09/20 0318 09/09/20 0518        Rosha Cocker M.D on 09/11/2020 at 11:12 AM  To page go to [email or web address]  [facility] -  Office  [phone]     Objective:   Vitals:   09/11/20 0439 09/11/20 0640 09/11/20 0735 09/11/20 0849  BP: 140/79  134/79   Pulse: 91  87   Resp: (!) 21     Temp: 98.8 F (37.1 C)  98 F (36.7 C)   TempSrc: Axillary  Oral   SpO2: 92% 94% 90% 92%  Weight:      Height:        Wt Readings from Last 3 Encounters:  09/08/20 122.5 kg  11/01/19 126.5 kg  03/16/16 116.8 kg     Intake/Output Summary (Last 24 hours) at 09/11/2020 1112 Last data filed at 09/11/2020 1054 Gross per 24 hour  Intake 940 ml  Output 1175 ml  Net -235 ml     Physical Exam  Awake Alert, Oriented X 3, No new F.N deficits, Normal  affect Symmetrical Chest wall movement, Good air movement bilaterally, CTAB RRR,No Gallops,Rubs or new Murmurs, No Parasternal Heave +ve B.Sounds, Abd Soft, No tenderness, No rebound - guarding or rigidity. No Cyanosis, Clubbing or edema, No new Rash or bruise     Data Review:    CBC Recent Labs  Lab 09/08/20 1956 09/09/20 0718 09/10/20 0018 09/11/20 0146  WBC 4.5 4.9 4.9 4.6  HGB 15.7 14.7 14.4 16.3  HCT 46.5 44.9 42.3 47.3  PLT 126* 125* 159 216  MCV 89.1 89.4 88.1 87.6  MCH 30.1 29.3 30.0 30.2  MCHC 33.8 32.7 34.0 34.5  RDW 13.3 13.3 13.2 13.2  LYMPHSABS 0.6* 0.5* 0.8 1.0  MONOABS 0.3 0.2 0.5 0.3  EOSABS 0.0 0.0 0.0 0.0  BASOSABS 0.0 0.0 0.0 0.0    Recent Labs  Lab 09/08/20 1956 09/09/20 0718 09/10/20 0018 09/11/20 0146  NA 138 137 140 139  K 3.5 3.9 4.1 4.0  CL 101 102 101 99  CO2 24 21* 26 24  GLUCOSE 137* 156* 117* 153*  BUN 15 14 17  24*  CREATININE 0.91 0.84 0.86 0.91  CALCIUM 8.8* 8.6* 8.9 9.3  AST 50* 49* 43* 47*  ALT 51* 46* 41 45*  ALKPHOS 45 39 40 42  BILITOT 1.2 0.7 0.8 0.9  ALBUMIN 3.4* 3.1* 3.0* 3.3*  MG  --   --  2.3 2.5*  CRP  --   --  9.4* 4.0*  DDIMER  --  0.63* 0.65* 1.91*  PROCALCITON  --  <0.10  --   --   LATICACIDVEN 1.7  --   --   --     ------------------------------------------------------------------------------------------------------------------ No results for input(s): CHOL, HDL, LDLCALC, TRIG, CHOLHDL, LDLDIRECT in the last 72 hours.  No results found for: HGBA1C ------------------------------------------------------------------------------------------------------------------ No results for input(s): TSH, T4TOTAL, T3FREE, THYROIDAB in the last 72 hours.  Invalid input(s): FREET3  Cardiac Enzymes No results for input(s): CKMB, TROPONINI, MYOGLOBIN in the last 168 hours.  Invalid input(s): CK ------------------------------------------------------------------------------------------------------------------ No results  found for: BNP  Micro Results Recent Results (from the past 240 hour(s))  Blood culture (routine x 2)     Status: None (Preliminary result)   Collection Time: 09/08/20  7:56 PM   Specimen: BLOOD RIGHT ARM  Result Value Ref Range Status   Specimen Description BLOOD RIGHT ARM  Final   Special Requests   Final    BOTTLES DRAWN AEROBIC AND ANAEROBIC Blood Culture adequate volume   Culture   Final    NO GROWTH 3 DAYS Performed at [facility], [address], [place], [place] [address]    Report Status PENDING  Incomplete  Blood culture (routine x 2)     Status: None (Preliminary result)   Collection Time: 09/08/20  7:56 PM   Specimen: BLOOD LEFT ARM  Result Value Ref Range Status   Specimen Description BLOOD LEFT ARM  Final   Special Requests   Final    BOTTLES DRAWN AEROBIC ONLY Blood Culture adequate volume   Culture   Final    NO GROWTH 3 DAYS Performed at [facility], [address], [place], [place] [address]    Report Status PENDING  Incomplete  Resp Panel by RT-PCR (Flu A&B, Covid) Nasopharyngeal Swab     Status: Abnormal   Collection Time: 09/09/20 12:29 AM   Specimen: Nasopharyngeal Swab; Nasopharyngeal(NP) swabs in vial transport medium  Result Value Ref Range Status   SARS Coronavirus 2 by RT PCR POSITIVE (A) NEGATIVE Final    Comment: RESULT CALLED TO, READ BACK BY AND VERIFIED WITH: Sherryl Manges RN 09/09/20 0207 JDW    Influenza A by PCR NEGATIVE NEGATIVE Final   Influenza B by PCR NEGATIVE NEGATIVE Final    Comment: Performed at [facility], [address], [place], [place] [address]    Radiology Reports DG Chest Portable 1 View  Result Date: 09/08/2020 CLINICAL DATA:  Shortness of breath. EXAM: PORTABLE CHEST 1 VIEW COMPARISON:  10/24/2008 FINDINGS: The heart size and mediastinal contours are within normal limits. Diffuse interstitial prominence appears increased since previous study in 2010. This may be due to worsening chronic interstitial  changes, although interstitial edema or pneumonitis cannot be excluded. No evidence of pulmonary airspace disease or pleural effusion. IMPRESSION: Increased diffuse interstitial prominence since previous study in 2010. This may be due to worsening chronic interstitial changes, although interstitial edema or pneumonitis cannot be excluded. Electronically Signed   By: Danae Orleans M.D.   On: 09/08/2020 20:11

## 2020-09-12 DIAGNOSIS — U071 COVID-19: Secondary | ICD-10-CM | POA: Diagnosis not present

## 2020-09-12 DIAGNOSIS — J9600 Acute respiratory failure, unspecified whether with hypoxia or hypercapnia: Secondary | ICD-10-CM | POA: Diagnosis not present

## 2020-09-12 LAB — COMPREHENSIVE METABOLIC PANEL
ALT: 87 U/L — ABNORMAL HIGH (ref 0–44)
AST: 87 U/L — ABNORMAL HIGH (ref 15–41)
Albumin: 3.1 g/dL — ABNORMAL LOW (ref 3.5–5.0)
Alkaline Phosphatase: 43 U/L (ref 38–126)
Anion gap: 13 (ref 5–15)
BUN: 32 mg/dL — ABNORMAL HIGH (ref 6–20)
CO2: 24 mmol/L (ref 22–32)
Calcium: 8.9 mg/dL (ref 8.9–10.3)
Chloride: 101 mmol/L (ref 98–111)
Creatinine, Ser: 0.99 mg/dL (ref 0.61–1.24)
GFR, Estimated: >60 mL/min (ref >60)
Glucose, Bld: 171 mg/dL — ABNORMAL HIGH (ref 70–99)
Potassium: 4.2 mmol/L (ref 3.5–5.1)
Sodium: 138 mmol/L (ref 135–145)
Total Bilirubin: 1.1 mg/dL (ref 0.3–1.2)
Total Protein: 7 g/dL (ref 6.5–8.1)

## 2020-09-12 LAB — CBC WITH DIFFERENTIAL/PLATELET
Abs Immature Granulocytes: 0.34 K/uL — ABNORMAL HIGH (ref 0.00–0.07)
Basophils Absolute: 0 K/uL (ref 0.0–0.1)
Basophils Relative: 1 %
Eosinophils Absolute: 0 K/uL (ref 0.0–0.5)
Eosinophils Relative: 0 %
HCT: 45.6 % (ref 39.0–52.0)
Hemoglobin: 15.2 g/dL (ref 13.0–17.0)
Immature Granulocytes: 4 %
Lymphocytes Relative: 13 %
Lymphs Abs: 1.1 K/uL (ref 0.7–4.0)
MCH: 29.3 pg (ref 26.0–34.0)
MCHC: 33.3 g/dL (ref 30.0–36.0)
MCV: 88 fL (ref 80.0–100.0)
Monocytes Absolute: 0.7 K/uL (ref 0.1–1.0)
Monocytes Relative: 8 %
Neutro Abs: 6.6 K/uL (ref 1.7–7.7)
Neutrophils Relative %: 74 %
Platelets: 265 K/uL (ref 150–400)
RBC: 5.18 MIL/uL (ref 4.22–5.81)
RDW: 12.9 % (ref 11.5–15.5)
WBC: 8.8 K/uL (ref 4.0–10.5)
nRBC: 0 % (ref 0.0–0.2)

## 2020-09-12 LAB — MAGNESIUM: Magnesium: 2.7 mg/dL — ABNORMAL HIGH (ref 1.7–2.4)

## 2020-09-12 LAB — GLUCOSE, CAPILLARY
Glucose-Capillary: 194 mg/dL — ABNORMAL HIGH (ref 70–99)
Glucose-Capillary: 251 mg/dL — ABNORMAL HIGH (ref 70–99)
Glucose-Capillary: 195 mg/dL — ABNORMAL HIGH (ref 70–99)

## 2020-09-12 LAB — D-DIMER, QUANTITATIVE: D-Dimer, Quant: 0.66 ug/mL-FEU — ABNORMAL HIGH (ref 0.00–0.50)

## 2020-09-12 LAB — HEMOGLOBIN A1C
Hgb A1c MFr Bld: 6.1 % — ABNORMAL HIGH (ref 4.8–5.6)
Mean Plasma Glucose: 128.37 mg/dL

## 2020-09-12 LAB — C-REACTIVE PROTEIN: CRP: 1 mg/dL — ABNORMAL HIGH

## 2020-09-12 MED ORDER — INSULIN ASPART 100 UNIT/ML ~~LOC~~ SOLN
0.0000 [IU] | Freq: Three times a day (TID) | SUBCUTANEOUS | Status: DC
  Administered 2020-09-12: 5 [IU] via SUBCUTANEOUS
  Administered 2020-09-13 (×2): 3 [IU] via SUBCUTANEOUS
  Administered 2020-09-13: 2 [IU] via SUBCUTANEOUS
  Administered 2020-09-14: 3 [IU] via SUBCUTANEOUS
  Administered 2020-09-14: 2 [IU] via SUBCUTANEOUS

## 2020-09-12 NOTE — Progress Notes (Signed)
PROGRESS NOTE                                                                             PROGRESS NOTE                                                                                                                                                                                                             Patient Demographics:    Seth Garcia, is a 54 y.o. male, DOB - 27-May-1966, YYT:[id number]  Outpatient Primary MD for the patient is [name], MD    LOS - 2  Admit date - 09/08/2020    Chief Complaint  Patient presents with  . Covid Positive       Brief Narrative    54 year old male with past medical history of hypertension who presents to [facility] emergency department with shortness of breath fever and weakness. -He was COVID-19 positive, he was given monoclonal antibody in ED, but when he had declined any clinical condition, so admission was requested for acute hypoxic respiratory failure due to COVID-19 of pneumonia, and he is unvaccinated against Covid .    Subjective:    Seth Garcia today ports good night sleep with Ambien, dyspnea mainly with activity, reports cough as well, denies fever, chest pain.     Assessment  & Plan :    Principal Problem:   COVID-19 virus infection Active Problems:   Acute respiratory failure with hypoxia (HCC)   Thrombocytopenia (HCC)   Essential hypertension   GERD without esophagitis   Acute respiratory failure due to COVID-19 (HCC)  Acute Hypoxic Resp. Failure due to Acute Covid 19 Viral Pneumonitis during the ongoing 2020 Covid 19 Pandemic  -He is unvaccinated. -He received monoclonal antibody in ED. -Continue with IV steroids , he is currently on IV Solu-Medrol . -Continue with IV remdesivir . -Received Actemra 12/2 -Having oxygen requirement at rest, this morning he is on 3 L nasal cannula, remains significantly hypoxic with activity requiring up to 15 L. - Encouraged the  patient  to sit up in chair in the daytime use I-S and flutter valve for pulmonary toiletry and then prone in bed when at night.  Will advance activity and titrate down oxygen as possible. -Continue to trend inflammatory markers closely occluding CRP and D-dimers.   SpO2: 93 % O2 Flow Rate (L/min): 2 L/min  Recent Labs  Lab 09/08/20 1956 09/09/20 0029 09/09/20 0718 09/10/20 0018 09/11/20 0146 09/12/20 0335  WBC 4.5  --  4.9 4.9 4.6 8.8  PLT 126*  --  125* 159 216 265  CRP  --   --   --  9.4* 4.0* 1.0*  DDIMER  --   --  0.63* 0.65* 1.91* 0.66*  PROCALCITON  --   --  <0.10  --   --   --   AST 50*  --  49* 43* 47* 87*  ALT 51*  --  46* 41 45* 87*  ALKPHOS 45  --  39 40 42 43  BILITOT 1.2  --  0.7 0.8 0.9 1.1  ALBUMIN 3.4*  --  3.1* 3.0* 3.3* 3.1*  LATICACIDVEN 1.7  --   --   --   --   --   SARSCOV2NAA  --  POSITIVE*  --   --   --   --      Thrombocytopenia  -Due to Covid, resolved  Transaminitis -Due to Covid, monitor closely  Essential hypertension -Continue with lisinopril, appears to be mildly dry, with increased BUN and hemoglobin, will discontinue hydrochlorothiazide and start on Norvasc instead .  GERD -Continue with PPI  Hyperglycemia -Due to steroids and Covid, will add insulin sliding scale     Condition - Extremely Guarded  Family communication: D/W wife by phone.  Code Status :  full  Consults  :  none  Procedures  :  none  Disposition Plan  :    Status is: Observation  The patient will require care spanning > 2 midnights and should be moved to inpatient because: Hemodynamically unstable and IV treatments appropriate due to intensity of illness or inability to take PO  Dispo: The patient is from: Home              Anticipated d/c is to: Home              Anticipated d/c date is: 3 days              Patient currently is not medically stable to d/c.      DVT Prophylaxis  :  Lovenox   Lab Results  Component Value Date   PLT 265  09/12/2020    Diet :  Diet Order            Diet 2 gram sodium Room service appropriate? Yes; Fluid consistency: Thin  Diet effective now                  Inpatient Medications  Scheduled Meds: . amLODipine  5 mg Oral Daily  . vitamin C  500 mg Oral Daily  . aspirin  81 mg Oral Daily  . enoxaparin (LOVENOX) injection  60 mg Subcutaneous Q24H  . lisinopril  20 mg Oral Daily  . mouth rinse  15 mL Mouth Rinse BID  . methylPREDNISolone (SOLU-MEDROL) injection  60 mg Intravenous Q8H  . pantoprazole  40 mg Oral Daily  . zinc sulfate  220 mg Oral Daily   Continuous Infusions: . remdesivir 100 mg in NS 100 mL 100 mg (09/12/20 0910)  PRN Meds:.acetaminophen, albuterol, guaiFENesin-dextromethorphan, hydrALAZINE, influenza vac split quadrivalent PF, ondansetron **OR** ondansetron (ZOFRAN) IV, polyethylene glycol, zolpidem  Antibiotics  :    Anti-infectives (From admission, onward)   Start     Dose/Rate Route Frequency Ordered Stop   09/10/20 1000  remdesivir 100 mg in sodium chloride 0.9 % 100 mL IVPB       "Followed by" Linked Group Details   100 mg 200 mL/hr over 30 Minutes Intravenous Daily 09/09/20 0318 09/14/20 0959   09/09/20 0500  remdesivir 200 mg in sodium chloride 0.9% 250 mL IVPB       "Followed by" Linked Group Details   200 mg 580 mL/hr over 30 Minutes Intravenous Once 09/09/20 0318 09/09/20 0518        Shameria Trimarco M.D on 09/12/2020 at 11:52 AM  To page go to [email or web address]  [facility] -  Office  [phone]     Objective:   Vitals:   09/11/20 2000 09/11/20 2014 09/12/20 0544 09/12/20 0757  BP:  121/90 (!) 109/57 134/72  Pulse: 90 88 94 75  Resp: 20 20 18 18   Temp:  98.5 F (36.9 C) 98.7 F (37.1 C) 98.2 F (36.8 C)  TempSrc:  Oral Oral Oral  SpO2: 92% 92% 91% 93%  Weight:      Height:        Wt Readings from Last 3 Encounters:  09/08/20 122.5 kg  11/01/19 126.5 kg  03/16/16 116.8 kg     Intake/Output Summary (Last 24  hours) at 09/12/2020 1152 Last data filed at 09/12/2020 0853 Gross per 24 hour  Intake 740 ml  Output 1700 ml  Net -960 ml     Physical Exam  Awake Alert, Oriented X 3, No new F.N deficits, Normal affect Symmetrical Chest wall movement, Good air movement bilaterally, CTAB RRR,No Gallops,Rubs or new Murmurs, No Parasternal Heave +ve B.Sounds, Abd Soft, No tenderness, No rebound - guarding or rigidity. No Cyanosis, Clubbing or edema, No new Rash or bruise       Data Review:    CBC Recent Labs  Lab 09/08/20 1956 09/09/20 0718 09/10/20 0018 09/11/20 0146 09/12/20 0335  WBC 4.5 4.9 4.9 4.6 8.8  HGB 15.7 14.7 14.4 16.3 15.2  HCT 46.5 44.9 42.3 47.3 45.6  PLT 126* 125* 159 216 265  MCV 89.1 89.4 88.1 87.6 88.0  MCH 30.1 29.3 30.0 30.2 29.3  MCHC 33.8 32.7 34.0 34.5 33.3  RDW 13.3 13.3 13.2 13.2 12.9  LYMPHSABS 0.6* 0.5* 0.8 1.0 1.1  MONOABS 0.3 0.2 0.5 0.3 0.7  EOSABS 0.0 0.0 0.0 0.0 0.0  BASOSABS 0.0 0.0 0.0 0.0 0.0    Recent Labs  Lab 09/08/20 1956 09/09/20 0718 09/10/20 0018 09/11/20 0146 09/12/20 0335  NA 138 137 140 139 138  K 3.5 3.9 4.1 4.0 4.2  CL 101 102 101 99 101  CO2 24 21* 26 24 24   GLUCOSE 137* 156* 117* 153* 171*  BUN 15 14 17  24* 32*  CREATININE 0.91 0.84 0.86 0.91 0.99  CALCIUM 8.8* 8.6* 8.9 9.3 8.9  AST 50* 49* 43* 47* 87*  ALT 51* 46* 41 45* 87*  ALKPHOS 45 39 40 42 43  BILITOT 1.2 0.7 0.8 0.9 1.1  ALBUMIN 3.4* 3.1* 3.0* 3.3* 3.1*  MG  --   --  2.3 2.5* 2.7*  CRP  --   --  9.4* 4.0* 1.0*  DDIMER  --  0.63* 0.65* 1.91* 0.66*  PROCALCITON  --  <0.10  --   --   --  LATICACIDVEN 1.7  --   --   --   --     ------------------------------------------------------------------------------------------------------------------ No results for input(s): CHOL, HDL, LDLCALC, TRIG, CHOLHDL, LDLDIRECT in the last 72 hours.  No results found for:  HGBA1C ------------------------------------------------------------------------------------------------------------------ No results for input(s): TSH, T4TOTAL, T3FREE, THYROIDAB in the last 72 hours.  Invalid input(s): FREET3  Cardiac Enzymes No results for input(s): CKMB, TROPONINI, MYOGLOBIN in the last 168 hours.  Invalid input(s): CK ------------------------------------------------------------------------------------------------------------------ No results found for: BNP  Micro Results Recent Results (from the past 240 hour(s))  Blood culture (routine x 2)     Status: None (Preliminary result)   Collection Time: 09/08/20  7:56 PM   Specimen: BLOOD RIGHT ARM  Result Value Ref Range Status   Specimen Description BLOOD RIGHT ARM  Final   Special Requests   Final    BOTTLES DRAWN AEROBIC AND ANAEROBIC Blood Culture adequate volume   Culture   Final    NO GROWTH 4 DAYS Performed at [facility], [address] [place], [place] [address]    Report Status PENDING  Incomplete  Blood culture (routine x 2)     Status: None (Preliminary result)   Collection Time: 09/08/20  7:56 PM   Specimen: BLOOD LEFT ARM  Result Value Ref Range Status   Specimen Description BLOOD LEFT ARM  Final   Special Requests   Final    BOTTLES DRAWN AEROBIC ONLY Blood Culture adequate volume   Culture   Final    NO GROWTH 4 DAYS Performed at [facility], [address] [place], [place] [address]    Report Status PENDING  Incomplete  Resp Panel by RT-PCR (Flu A&B, Covid) Nasopharyngeal Swab     Status: Abnormal   Collection Time: 09/09/20 12:29 AM   Specimen: Nasopharyngeal Swab; Nasopharyngeal(NP) swabs in vial transport medium  Result Value Ref Range Status   SARS Coronavirus 2 by RT PCR POSITIVE (A) NEGATIVE Final    Comment: RESULT CALLED TO, READ BACK BY AND VERIFIED WITH: Sherryl MangesJ FERGUSON RN 09/09/20 0207 JDW    Influenza A by PCR NEGATIVE NEGATIVE Final   Influenza B by PCR NEGATIVE  NEGATIVE Final    Comment: Performed at [facility], [address] [place], [place] [address]    Radiology Reports DG Chest Portable 1 View  Result Date: 09/08/2020 CLINICAL DATA:  Shortness of breath. EXAM: PORTABLE CHEST 1 VIEW COMPARISON:  10/24/2008 FINDINGS: The heart size and mediastinal contours are within normal limits. Diffuse interstitial prominence appears increased since previous study in 2010. This may be due to worsening chronic interstitial changes, although interstitial edema or pneumonitis cannot be excluded. No evidence of pulmonary airspace disease or pleural effusion. IMPRESSION: Increased diffuse interstitial prominence since previous study in 2010. This may be due to worsening chronic interstitial changes, although interstitial edema or pneumonitis cannot be excluded. Electronically Signed   By: Danae OrleansJohn A Stahl M.D.   On: 09/08/2020 20:11

## 2020-09-13 DIAGNOSIS — I10 Essential (primary) hypertension: Secondary | ICD-10-CM | POA: Diagnosis not present

## 2020-09-13 DIAGNOSIS — U071 COVID-19: Secondary | ICD-10-CM | POA: Diagnosis not present

## 2020-09-13 LAB — CBC WITH DIFFERENTIAL/PLATELET
Abs Immature Granulocytes: 0.51 10*3/uL — ABNORMAL HIGH (ref 0.00–0.07)
Basophils Absolute: 0.1 10*3/uL (ref 0.0–0.1)
Basophils Relative: 1 %
Eosinophils Absolute: 0 10*3/uL (ref 0.0–0.5)
Eosinophils Relative: 0 %
HCT: 45.9 % (ref 39.0–52.0)
Hemoglobin: 15.8 g/dL (ref 13.0–17.0)
Immature Granulocytes: 5 %
Lymphocytes Relative: 11 %
Lymphs Abs: 1.2 10*3/uL (ref 0.7–4.0)
MCH: 30 pg (ref 26.0–34.0)
MCHC: 34.4 g/dL (ref 30.0–36.0)
MCV: 87.1 fL (ref 80.0–100.0)
Monocytes Absolute: 0.9 10*3/uL (ref 0.1–1.0)
Monocytes Relative: 8 %
Neutro Abs: 8.6 10*3/uL — ABNORMAL HIGH (ref 1.7–7.7)
Neutrophils Relative %: 75 %
Platelets: 296 10*3/uL (ref 150–400)
RBC: 5.27 MIL/uL (ref 4.22–5.81)
RDW: 13 % (ref 11.5–15.5)
WBC: 11.3 10*3/uL — ABNORMAL HIGH (ref 4.0–10.5)
nRBC: 0 % (ref 0.0–0.2)

## 2020-09-13 LAB — COMPREHENSIVE METABOLIC PANEL
ALT: 238 U/L — ABNORMAL HIGH (ref 0–44)
AST: 203 U/L — ABNORMAL HIGH (ref 15–41)
Albumin: 3.2 g/dL — ABNORMAL LOW (ref 3.5–5.0)
Alkaline Phosphatase: 48 U/L (ref 38–126)
Anion gap: 12 (ref 5–15)
BUN: 31 mg/dL — ABNORMAL HIGH (ref 6–20)
CO2: 25 mmol/L (ref 22–32)
Calcium: 8.7 mg/dL — ABNORMAL LOW (ref 8.9–10.3)
Chloride: 101 mmol/L (ref 98–111)
Creatinine, Ser: 0.95 mg/dL (ref 0.61–1.24)
GFR, Estimated: >60 mL/min (ref >60)
Glucose, Bld: 181 mg/dL — ABNORMAL HIGH (ref 70–99)
Potassium: 5.1 mmol/L (ref 3.5–5.1)
Sodium: 138 mmol/L (ref 135–145)
Total Bilirubin: 1.7 mg/dL — ABNORMAL HIGH (ref 0.3–1.2)
Total Protein: 6.7 g/dL (ref 6.5–8.1)

## 2020-09-13 LAB — D-DIMER, QUANTITATIVE: D-Dimer, Quant: 0.66 ug/mL-FEU — ABNORMAL HIGH (ref 0.00–0.50)

## 2020-09-13 LAB — CULTURE, BLOOD (ROUTINE X 2)
Culture: NO GROWTH
Culture: NO GROWTH
Special Requests: ADEQUATE
Special Requests: ADEQUATE

## 2020-09-13 LAB — MAGNESIUM: Magnesium: 3 mg/dL — ABNORMAL HIGH (ref 1.7–2.4)

## 2020-09-13 LAB — GLUCOSE, CAPILLARY
Glucose-Capillary: 174 mg/dL — ABNORMAL HIGH (ref 70–99)
Glucose-Capillary: 214 mg/dL — ABNORMAL HIGH (ref 70–99)
Glucose-Capillary: 220 mg/dL — ABNORMAL HIGH (ref 70–99)
Glucose-Capillary: 229 mg/dL — ABNORMAL HIGH (ref 70–99)

## 2020-09-13 LAB — C-REACTIVE PROTEIN: CRP: 0.6 mg/dL (ref <1.0)

## 2020-09-13 NOTE — Progress Notes (Signed)
PROGRESS NOTE                                                                             PROGRESS NOTE                                                                                                                                                                                                             Patient Demographics:    Seth Garcia, is a 54 y.o. male, DOB - 03-31-1966, BVA:[id number]  Outpatient Primary MD for the patient is [name], MD    LOS - 3  Admit date - 09/08/2020    Chief Complaint  Patient presents with  . Covid Positive       Brief Narrative    54 year old male with past medical history of hypertension who presents to [facility] emergency department with shortness of breath fever and weakness. -He was COVID-19 positive, he was given monoclonal antibody in ED, but when he had declined any clinical condition, so admission was requested for acute hypoxic respiratory failure due to COVID-19 of pneumonia, and he is unvaccinated against Covid .   Subjective:    [name] today dyspnea has improved, he has a good night sleep, reports cough has improved with cough medicine as well, denies any fever or chest pain .   Assessment  & Plan :    Principal Problem:   COVID-19 virus infection Active Problems:   Acute respiratory failure with hypoxia (HCC)   Thrombocytopenia (HCC)   Essential hypertension   GERD without esophagitis   Acute respiratory failure due to COVID-19 (HCC)  Acute Hypoxic Resp. Failure due to Acute Covid 19 Viral Pneumonitis during the ongoing 2020 Covid 19 Pandemic  -He is unvaccinated. -He received monoclonal antibody in ED. -Continue with IV steroids , he is currently on IV Solu-Medrol . -Repleted with IV remdesivir, I have continued today's last dose given elevated LFTs with AST>220 -Received Actemra 12/2 -This morning he is on 2 to 3 L at rest, he is with significant  oxygen  requirement with activity, will continue to ambulate and reassess oxygen needs . - Encouraged the patient to sit up in chair in the daytime use I-S and flutter valve for pulmonary toiletry and then prone in bed when at night.  Will advance activity and titrate down oxygen as possible. -Continue to trend inflammatory markers closely occluding CRP and D-dimers.   SpO2: 91 % O2 Flow Rate (L/min): 2 L/min  Recent Labs  Lab 09/08/20 1956 09/08/20 1956 09/09/20 0029 09/09/20 0718 09/10/20 0018 09/11/20 0146 09/12/20 0335 09/13/20 0420  WBC 4.5   < >  --  4.9 4.9 4.6 8.8 11.3*  PLT 126*   < >  --  125* 159 216 265 296  CRP  --   --   --   --  9.4* 4.0* 1.0* 0.6  DDIMER  --   --   --  0.63* 0.65* 1.91* 0.66* 0.66*  PROCALCITON  --   --   --  <0.10  --   --   --   --   AST 50*   < >  --  49* 43* 47* 87* 203*  ALT 51*   < >  --  46* 41 45* 87* 238*  ALKPHOS 45   < >  --  39 40 42 43 48  BILITOT 1.2   < >  --  0.7 0.8 0.9 1.1 1.7*  ALBUMIN 3.4*   < >  --  3.1* 3.0* 3.3* 3.1* 3.2*  LATICACIDVEN 1.7  --   --   --   --   --   --   --   SARSCOV2NAA  --   --  POSITIVE*  --   --   --   --   --    < > = values in this interval not displayed.     Thrombocytopenia  -Due to Covid, resolved  Transaminitis -Due to Covid, did significantly increased today, AST at 238, ALT at 203, alk phos within normal limit but T bili is trending up, will recheck tomorrow. -Did stop remdesivir and discontinue Tylenol  Essential hypertension -Continue with lisinopril, hydrochlorothiazide has been changed to Norvasc .  GERD -Continue with PPI  Hyperglycemia -Due to steroids and Covid, will add insulin sliding scale     Condition - Extremely Guarded  Family communication: D/W wife by phone.  Code Status :  full  Consults  :  none  Procedures  :  none  Disposition Plan  :    Status is: Observation  The patient will require care spanning > 2 midnights and should be moved to inpatient because:  Hemodynamically unstable and IV treatments appropriate due to intensity of illness or inability to take PO  Dispo: The patient is from: Home              Anticipated d/c is to: Home              Anticipated d/c date is: 2 days              Patient currently is not medically stable to d/c.      DVT Prophylaxis  :  Lovenox   Lab Results  Component Value Date   PLT 296 09/13/2020    Diet :  Diet Order            Diet 2 gram sodium Room service appropriate? Yes; Fluid consistency: Thin  Diet effective now  Inpatient Medications  Scheduled Meds: . amLODipine  5 mg Oral Daily  . vitamin C  500 mg Oral Daily  . aspirin  81 mg Oral Daily  . enoxaparin (LOVENOX) injection  60 mg Subcutaneous Q24H  . insulin aspart  0-9 Units Subcutaneous TID WC  . lisinopril  20 mg Oral Daily  . mouth rinse  15 mL Mouth Rinse BID  . methylPREDNISolone (SOLU-MEDROL) injection  60 mg Intravenous Q8H  . pantoprazole  40 mg Oral Daily  . zinc sulfate  220 mg Oral Daily   Continuous Infusions:  PRN Meds:.acetaminophen, albuterol, guaiFENesin-dextromethorphan, hydrALAZINE, influenza vac split quadrivalent PF, ondansetron **OR** ondansetron (ZOFRAN) IV, polyethylene glycol, zolpidem  Antibiotics  :    Anti-infectives (From admission, onward)   Start     Dose/Rate Route Frequency Ordered Stop   09/10/20 1000  remdesivir 100 mg in sodium chloride 0.9 % 100 mL IVPB  Status:  Discontinued       "Followed by" Linked Group Details   100 mg 200 mL/hr over 30 Minutes Intravenous Daily 09/09/20 0318 09/13/20 0819   09/09/20 0500  remdesivir 200 mg in sodium chloride 0.9% 250 mL IVPB       "Followed by" Linked Group Details   200 mg 580 mL/hr over 30 Minutes Intravenous Once 09/09/20 0318 09/09/20 0518        Caellum Mancil M.D on 09/13/2020 at 11:34 AM  To page go to [email or web address]  [facility] -  Office  [phone]     Objective:   Vitals:   09/12/20 1414  09/12/20 2030 09/13/20 0348 09/13/20 0757  BP: 135/73 (!) 129/57 131/64 133/61  Pulse: 95   71  Resp: _0 Temp: 98.4 F (36.9 C) 98.5 F (36.9 C) 98.7 F (37.1 C) 98.3 F (36.8 C)  TempSrc: Oral Oral Oral Oral  SpO2: 95% 92% 90% 91%  Weight:      Height:        Wt Readings from Last 3 Encounters:  09/08/20 122.5 kg  11/01/19 126.5 kg  03/16/16 116.8 kg     Intake/Output Summary (Last 24 hours) at 09/13/2020 1134 Last data filed at 09/13/2020 0600 Gross per 24 hour  Intake 955.36 ml  Output 1170 ml  Net -214.64 ml     Physical Exam  Awake Alert, Oriented X 3, No new F.N deficits, Normal affect Symmetrical Chest wall movement, Good air movement bilaterally, CTAB RRR,No Gallops,Rubs or new Murmurs, No Parasternal Heave +ve B.Sounds, Abd Soft, No tenderness, No rebound - guarding or rigidity. No Cyanosis, Clubbing or edema, No new Rash or bruise       Data Review:    CBC Recent Labs  Lab 09/09/20 0718 09/10/20 0018 09/11/20 0146 09/12/20 0335 09/13/20 0420  WBC 4.9 4.9 4.6 8.8 11.3*  HGB 14.7 14.4 16.3 15.2 15.8  HCT 44.9 42.3 47.3 45.6 45.9  PLT 125* 159 216 265 296  MCV 89.4 88.1 87.6 88.0 87.1  MCH 29.3 30.0 30.2 29.3 30.0  MCHC 32.7 34.0 34.5 33.3 34.4  RDW 13.3 13.2 13.2 12.9 13.0  LYMPHSABS 0.5* 0.8 1.0 1.1 1.2  MONOABS 0.2 0.5 0.3 0.7 0.9  EOSABS 0.0 0.0 0.0 0.0 0.0  BASOSABS 0.0 0.0 0.0 0.0 0.1    Recent Labs  Lab 09/08/20 1956 09/08/20 1956 09/09/20 0718 09/10/20 0018 09/11/20 0146 09/12/20 0335 09/13/20 0420  NA 138   < > 137 140 139 138 138  K 3.5   < > 3.9 4.1  4.0 4.2 5.1  CL 101   < > 102 101 99 101 101  CO2 24   < > 21* _0 GLUCOSE 137*   < > 156* 117* 153* 171* 181*  BUN 15   < > 14 17 24* 32* 31*  CREATININE 0.91   < > 0.84 0.86 0.91 0.99 0.95  CALCIUM 8.8*   < > 8.6* 8.9 9.3 8.9 8.7*  AST 50*   < > 49* 43* 47* 87* 203*  ALT 51*   < > 46* 41 45* 87* 238*  ALKPHOS 45   < > 39 40 42 43 48  BILITOT 1.2   <  > 0.7 0.8 0.9 1.1 1.7*  ALBUMIN 3.4*   < > 3.1* 3.0* 3.3* 3.1* 3.2*  MG  --   --   --  2.3 2.5* 2.7* 3.0*  CRP  --   --   --  9.4* 4.0* 1.0* 0.6  DDIMER  --   --  0.63* 0.65* 1.91* 0.66* 0.66*  PROCALCITON  --   --  <0.10  --   --   --   --   LATICACIDVEN 1.7  --   --   --   --   --   --   HGBA1C  --   --   --   --   --  6.1*  --    < > = values in this interval not displayed.    ------------------------------------------------------------------------------------------------------------------ No results for input(s): CHOL, HDL, LDLCALC, TRIG, CHOLHDL, LDLDIRECT in the last 72 hours.  Lab Results  Component Value Date   HGBA1C 6.1 (H) 09/12/2020   ------------------------------------------------------------------------------------------------------------------ No results for input(s): TSH, T4TOTAL, T3FREE, THYROIDAB in the last 72 hours.  Invalid input(s): FREET3  Cardiac Enzymes No results for input(s): CKMB, TROPONINI, MYOGLOBIN in the last 168 hours.  Invalid input(s): CK ------------------------------------------------------------------------------------------------------------------ No results found for: BNP  Micro Results Recent Results (from the past 240 hour(s))  Blood culture (routine x 2)     Status: None   Collection Time: 09/08/20  7:56 PM   Specimen: BLOOD RIGHT ARM  Result Value Ref Range Status   Specimen Description BLOOD RIGHT ARM  Final   Special Requests   Final    BOTTLES DRAWN AEROBIC AND ANAEROBIC Blood Culture adequate volume   Culture   Final    NO GROWTH 5 DAYS Performed at [facility], [address] [place], [place] [address]    Report Status 09/13/2020 FINAL  Final  Blood culture (routine x 2)     Status: None   Collection Time: 09/08/20  7:56 PM   Specimen: BLOOD LEFT ARM  Result Value Ref Range Status   Specimen Description BLOOD LEFT ARM  Final   Special Requests   Final    BOTTLES DRAWN AEROBIC ONLY Blood Culture adequate volume    Culture   Final    NO GROWTH 5 DAYS Performed at [facility], [place] [address], [place], [place] [address]    Report Status 09/13/2020 FINAL  Final  Resp Panel by RT-PCR (Flu A&B, Covid) Nasopharyngeal Swab     Status: Abnormal   Collection Time: 09/09/20 12:29 AM   Specimen: Nasopharyngeal Swab; Nasopharyngeal(NP) swabs in vial transport medium  Result Value Ref Range Status   SARS Coronavirus 2 by RT PCR POSITIVE (A) NEGATIVE Final    Comment: RESULT CALLED TO, READ BACK BY AND VERIFIED WITH: Redmond School RN 09/09/20 0207  JDW    Influenza A by PCR NEGATIVE NEGATIVE Final   Influenza B by PCR NEGATIVE NEGATIVE Final    Comment: Performed at [facility], [place] [address], [place], [place] [address]    Radiology Reports DG Chest Portable 1 View  Result Date: 09/08/2020 CLINICAL DATA:  Shortness of breath. EXAM: PORTABLE CHEST 1 VIEW COMPARISON:  10/24/2008 FINDINGS: The heart size and mediastinal contours are within normal limits. Diffuse interstitial prominence appears increased since previous study in 2010. This may be due to worsening chronic interstitial changes, although interstitial edema or pneumonitis cannot be excluded. No evidence of pulmonary airspace disease or pleural effusion. IMPRESSION: Increased diffuse interstitial prominence since previous study in 2010. This may be due to worsening chronic interstitial changes, although interstitial edema or pneumonitis cannot be excluded. Electronically Signed   By: Marlaine Hind M.D.   On: 09/08/2020 20:11

## 2020-09-14 DIAGNOSIS — J9600 Acute respiratory failure, unspecified whether with hypoxia or hypercapnia: Secondary | ICD-10-CM | POA: Diagnosis not present

## 2020-09-14 DIAGNOSIS — U071 COVID-19: Secondary | ICD-10-CM | POA: Diagnosis not present

## 2020-09-14 DIAGNOSIS — I10 Essential (primary) hypertension: Secondary | ICD-10-CM | POA: Diagnosis not present

## 2020-09-14 LAB — COMPREHENSIVE METABOLIC PANEL
ALT: 205 U/L — ABNORMAL HIGH (ref 0–44)
AST: 71 U/L — ABNORMAL HIGH (ref 15–41)
Albumin: 3.2 g/dL — ABNORMAL LOW (ref 3.5–5.0)
Alkaline Phosphatase: 49 U/L (ref 38–126)
Anion gap: 11 (ref 5–15)
BUN: 31 mg/dL — ABNORMAL HIGH (ref 6–20)
CO2: 24 mmol/L (ref 22–32)
Calcium: 9 mg/dL (ref 8.9–10.3)
Chloride: 104 mmol/L (ref 98–111)
Creatinine, Ser: 0.98 mg/dL (ref 0.61–1.24)
GFR, Estimated: >60 mL/min (ref >60)
Glucose, Bld: 192 mg/dL — ABNORMAL HIGH (ref 70–99)
Potassium: 4.7 mmol/L (ref 3.5–5.1)
Sodium: 139 mmol/L (ref 135–145)
Total Bilirubin: 1.1 mg/dL (ref 0.3–1.2)
Total Protein: 7.1 g/dL (ref 6.5–8.1)

## 2020-09-14 LAB — CBC WITH DIFFERENTIAL/PLATELET
Abs Immature Granulocytes: 0.75 10*3/uL — ABNORMAL HIGH (ref 0.00–0.07)
Basophils Absolute: 0.1 10*3/uL (ref 0.0–0.1)
Basophils Relative: 1 %
Eosinophils Absolute: 0 10*3/uL (ref 0.0–0.5)
Eosinophils Relative: 0 %
HCT: 46.9 % (ref 39.0–52.0)
Hemoglobin: 15.8 g/dL (ref 13.0–17.0)
Immature Granulocytes: 5 %
Lymphocytes Relative: 10 %
Lymphs Abs: 1.4 10*3/uL (ref 0.7–4.0)
MCH: 29.5 pg (ref 26.0–34.0)
MCHC: 33.7 g/dL (ref 30.0–36.0)
MCV: 87.7 fL (ref 80.0–100.0)
Monocytes Absolute: 0.8 10*3/uL (ref 0.1–1.0)
Monocytes Relative: 6 %
Neutro Abs: 11 10*3/uL — ABNORMAL HIGH (ref 1.7–7.7)
Neutrophils Relative %: 78 %
Platelets: 311 10*3/uL (ref 150–400)
RBC: 5.35 MIL/uL (ref 4.22–5.81)
RDW: 12.8 % (ref 11.5–15.5)
WBC: 14 10*3/uL — ABNORMAL HIGH (ref 4.0–10.5)
nRBC: 0.1 % (ref 0.0–0.2)

## 2020-09-14 LAB — D-DIMER, QUANTITATIVE: D-Dimer, Quant: 0.51 ug/mL-FEU — ABNORMAL HIGH (ref 0.00–0.50)

## 2020-09-14 LAB — C-REACTIVE PROTEIN: CRP: <0.5 mg/dL (ref <1.0)

## 2020-09-14 LAB — GLUCOSE, CAPILLARY
Glucose-Capillary: 199 mg/dL — ABNORMAL HIGH (ref 70–99)
Glucose-Capillary: 225 mg/dL — ABNORMAL HIGH (ref 70–99)

## 2020-09-14 LAB — MAGNESIUM: Magnesium: 2.9 mg/dL — ABNORMAL HIGH (ref 1.7–2.4)

## 2020-09-14 MED ORDER — PANTOPRAZOLE SODIUM 40 MG PO TBEC: 40.0000 mg | DELAYED_RELEASE_TABLET | Freq: Every day | ORAL | 0 refills | Status: AC

## 2020-09-14 MED ORDER — AMLODIPINE BESYLATE 5 MG PO TABS
5.0000 mg | ORAL_TABLET | Freq: Every day | ORAL | 0 refills | Status: DC
Start: 2020-09-15 — End: 2022-05-16

## 2020-09-14 MED ORDER — DEXAMETHASONE 6 MG PO TABS: 6.0000 mg | ORAL_TABLET | Freq: Every day | ORAL | 0 refills | Status: DC

## 2020-09-14 MED ORDER — LISINOPRIL 20 MG PO TABS: 20.0000 mg | ORAL_TABLET | Freq: Every day | ORAL | 0 refills | Status: AC

## 2020-09-14 NOTE — Progress Notes (Signed)
SATURATION QUALIFICATIONS: (This note is used to comply with regulatory documentation for home oxygen)  Patient Saturations on Room Air at Rest = 90%  Patient Saturations on Room Air while Ambulating = 86%  Patient Saturations on 3 Liters of oxygen while Ambulating = 92%  Please briefly explain why patient needs home oxygen: Needs supplemental oxygen at 3 L to maintain satuations > 88%.

## 2020-09-14 NOTE — Discharge Summary (Signed)
Seth Garcia, is a 54 y.o. male  DOB Dec 12, 1965  MRN [id number].  Admission date:  09/08/2020  Admitting Physician  Albertine Patricia, MD  Discharge Date:  09/14/2020   Primary MD  [name], MD  Recommendations for primary care physician for things to follow:  -Please check CBC, CMP during next visit to ensure liver enzymes are trending down.   Admission Diagnosis  Hypoxia [R09.02] COVID-19 virus infection [U07.1] COVID-19 [U07.1] Acute respiratory failure due to COVID-19 ([place]) [U07.1, J96.00]   Discharge Diagnosis  Hypoxia [R09.02] COVID-19 virus infection [U07.1] COVID-19 [U07.1] Acute respiratory failure due to COVID-19 ([place]) [U07.1, J96.00]    Principal Problem:   COVID-19 virus infection Active Problems:   Acute respiratory failure with hypoxia (HCC)   Thrombocytopenia (HCC)   Essential hypertension   GERD without esophagitis   Acute respiratory failure due to COVID-19 [facility])      Past Medical History:  Diagnosis Date  . Elbow pain   . GERD (gastroesophageal reflux disease)   . Hypertension   . Knee pain     Past Surgical History:  Procedure Laterality Date  . NO PAST SURGERIES         History of present illness and  Hospital Course:     Kindly see H&P for history of present illness and admission details, please review complete Labs, Consult reports and Test reports for all details in brief  HPI  from the history and physical done on the day of admission 09/08/2020  54 year old male with past medical history of hypertension who presents to [facility] emergency department with shortness of breath fever and weakness.  Patient explains that on Thanksgiving day he and several of his family members began to develop generalized weakness and fatigue.  Patient states that over the next several days his symptoms continued to progressively worsen.  This became  associated with shortness of breath, initially mild in intensity but progressively becoming more more severe.  Shortness of breath became severe in intensity, worse with exertion and improved with rest.  As shortness of breath worsened, patient also developed an associated cough, severe in intensity and dry without sputum production.  Patient also developed extremely poor appetite.  Patient states that over the span of time he also began to exhibit fevers as high as nearly 68 F.  Patient denies any changes in taste, smell or any evidence of diarrhea.  Of note, patient is not vaccinated for COVID-19.  Patient symptoms continued to progressively worsen until he eventually presented to [facility] emergency department for evaluation.  Upon evaluation in the emergency department patient was found to be hypoxic with oxygen saturations as low as the 70s.  Patient was placed on 4 L of supplemental oxygen to maintain oxygen saturations in the low 90s.  Emergency department provider initially felt the patient may be able to potentially go home and was provided with a monoclonal antibody infusion but throughout the ED course the patient continued to exhibit substantial symptoms and oxygen requirement  and therefore the hospitalist group has now been called to assess the patient for mission the hospital.   Hospital Course   Acute Hypoxic Resp. Failure due to Acute Covid 19 Viral Pneumonitis during the ongoing 2020 Covid 19 Pandemic  -He is unvaccinated. -He received monoclonal antibody in ED. -Treated with IV steroids, he will be discharged on oral Decadron for total of 10 days treatment, he was treated with IV remdesivir during hospital stay, has been stopped at dose 4 on 12/3 given elevated LFTs, he did receive Actemra on 12/2, he did show significant improvement of his symptoms, he is able to ambulate hallway on 3 L with no hypoxia, is tolerating 1.5 L oxygen at rest. -He was encouraged to take  incentive spirometry and flutter valve at home and keep using them.  Thrombocytopenia  -Due to Covid, resolved  Transaminitis -Due to Covid, did significantly increased today, AST at 238, ALT at 203, alk phos within normal limit, so I have stopped his remdesivir, LFTs currently trending down, this is all in the setting of Covid, abdominal exam is benign, he will need a CMP checked as an outpatient to ensure it is continues to trend down.   Essential hypertension -Continue with lisinopril, hydrochlorothiazide has been changed to Norvasc .  GERD -Continue with PPI  Hyperglycemia -Due to steroids and Covid    Discharge Condition:  Full       Discharge Instructions  and  Discharge Medications    Discharge Instructions    Diet - low sodium heart healthy   Complete by: As directed    Increase activity slowly   Complete by: As directed      Allergies as of 09/14/2020   No Known Allergies     Medication List    STOP taking these medications   lisinopril-hydrochlorothiazide 20-12.5 MG tablet Commonly known as: ZESTORETIC     TAKE these medications   amLODipine 5 MG tablet Commonly known as: NORVASC Take 1 tablet (5 mg total) by mouth daily. Start taking on: September 15, 2020   aspirin 81 MG tablet Take 81 mg by mouth daily.   dexamethasone 6 MG tablet Commonly known as: DECADRON Take 1 tablet (6 mg total) by mouth daily. Start taking on: September 15, 2020   EPINEPHrine 0.15 MG/0.15ML injection Commonly known as: ADRENACLICK Inject 1.51 mg into the muscle as needed for anaphylaxis.   lisinopril 20 MG tablet Commonly known as: ZESTRIL Take 1 tablet (20 mg total) by mouth daily. Start taking on: September 15, 2020   pantoprazole 40 MG tablet Commonly known as: PROTONIX Take 1 tablet (40 mg total) by mouth daily. Start taking on: September 15, 2020            Durable Medical Equipment  (From admission, onward)         Start     Ordered    09/14/20 1043  For home use only DME oxygen  Once       Question Answer Comment  Length of Need 6 Months   Mode or (Route) Nasal cannula   Liters per Minute 3   Frequency Continuous (stationary and portable oxygen unit needed)   Oxygen conserving device Yes   Oxygen delivery system Gas      09/14/20 1043            Diet and Activity recommendation: See Discharge Instructions above   Consults obtained -  None   Major procedures and Radiology Reports - PLEASE review detailed and final  reports for all details, in brief -     DG Chest Portable 1 View  Result Date: 09/08/2020 CLINICAL DATA:  Shortness of breath. EXAM: PORTABLE CHEST 1 VIEW COMPARISON:  10/24/2008 FINDINGS: The heart size and mediastinal contours are within normal limits. Diffuse interstitial prominence appears increased since previous study in 2010. This may be due to worsening chronic interstitial changes, although interstitial edema or pneumonitis cannot be excluded. No evidence of pulmonary airspace disease or pleural effusion. IMPRESSION: Increased diffuse interstitial prominence since previous study in 2010. This may be due to worsening chronic interstitial changes, although interstitial edema or pneumonitis cannot be excluded. Electronically Signed   By: Marlaine Hind M.D.   On: 09/08/2020 20:11    Micro Results    Recent Results (from the past 240 hour(s))  Blood culture (routine x 2)     Status: None   Collection Time: 09/08/20  7:56 PM   Specimen: BLOOD RIGHT ARM  Result Value Ref Range Status   Specimen Description BLOOD RIGHT ARM  Final   Special Requests   Final    BOTTLES DRAWN AEROBIC AND ANAEROBIC Blood Culture adequate volume   Culture   Final    NO GROWTH 5 DAYS Performed at [facility], [address] [place], [place] [address]    Report Status 09/13/2020 FINAL  Final  Blood culture (routine x 2)     Status: None   Collection Time: 09/08/20  7:56 PM   Specimen: BLOOD LEFT ARM   Result Value Ref Range Status   Specimen Description BLOOD LEFT ARM  Final   Special Requests   Final    BOTTLES DRAWN AEROBIC ONLY Blood Culture adequate volume   Culture   Final    NO GROWTH 5 DAYS Performed at [facility], [place] [address], [place], [place] [address]    Report Status 09/13/2020 FINAL  Final  Resp Panel by RT-PCR (Flu A&B, Covid) Nasopharyngeal Swab     Status: Abnormal   Collection Time: 09/09/20 12:29 AM   Specimen: Nasopharyngeal Swab; Nasopharyngeal(NP) swabs in vial transport medium  Result Value Ref Range Status   SARS Coronavirus 2 by RT PCR POSITIVE (A) NEGATIVE Final    Comment: RESULT CALLED TO, READ BACK BY AND VERIFIED WITH: Redmond School RN 09/09/20 0207 JDW    Influenza A by PCR NEGATIVE NEGATIVE Final   Influenza B by PCR NEGATIVE NEGATIVE Final    Comment: Performed at [facility], [place] [address] [place], [place] [address]       Today   Subjective:   Kingstin Heims today has no headache, no chest pain, no abdominal pain, reports dyspnea has significantly improved, tolerating ambulation on 3 L with activity.    Objective:   Blood pressure 135/77, pulse 91, temperature 98.3 F (36.8 C), temperature source Oral, resp. rate 18, height _0  (1.905 m), weight 122.5 kg, SpO2 100 %.   Intake/Output Summary (Last 24 hours) at 09/14/2020 1053 Last data filed at 09/14/2020 0801 Gross per 24 hour  Intake 240 ml  Output 1540 ml  Net -1300 ml    Exam Awake Alert, Oriented x 3, No new F.N deficits, Normal affect Symmetrical Chest wall movement, Good air movement bilaterally, CTAB RRR,No Gallops,Rubs or new Murmurs, No Parasternal Heave +ve B.Sounds, Abd Soft, Non tender, No organomegaly appriciated, No rebound -guarding or rigidity. No Cyanosis, Clubbing or edema, No new Rash or bruise  Data Review   CBC w Diff:  Lab Results  Component Value Date   WBC 14.0 (H) 09/14/2020   HGB 15.8 09/14/2020   HCT 46.9 09/14/2020   PLT 311  09/14/2020   LYMPHOPCT 10 09/14/2020   MONOPCT 6 09/14/2020   EOSPCT 0 09/14/2020   BASOPCT 1 09/14/2020    CMP:  Lab Results  Component Value Date   NA 139 09/14/2020   K 4.7 09/14/2020   CL 104 09/14/2020   CO2 24 09/14/2020   BUN 31 (H) 09/14/2020   CREATININE 0.98 09/14/2020   PROT 7.1 09/14/2020   ALBUMIN 3.2 (L) 09/14/2020   BILITOT 1.1 09/14/2020   ALKPHOS 49 09/14/2020   AST 71 (H) 09/14/2020   ALT 205 (H) 09/14/2020  .   Total Time in preparing paper work, data evaluation and todays exam - 64 minutes  Phillips Climes M.D on 09/14/2020 at 10:53 AM  [facility]   Office  [phone]

## 2020-09-14 NOTE — Discharge Instructions (Signed)
Person Under Monitoring Name: Seth Garcia  Location: [address] [place] [address]   Infection Prevention Recommendations for Individuals Confirmed to have, or Being Evaluated for, 2019 Novel Coronavirus (COVID-19) Infection Who Receive Care at Home  Individuals who are confirmed to have, or are being evaluated for, COVID-19 should follow the prevention steps below until a healthcare provider or local or state health department says they can return to normal activities.  Stay home except to get medical care You should restrict activities outside your home, except for getting medical care. Do not go to work, school, or public areas, and do not use public transportation or taxis.  Call ahead before visiting your doctor Before your medical appointment, call the healthcare provider and tell them that you have, or are being evaluated for, COVID-19 infection. This will help the healthcare provider's office take steps to keep other people from getting infected. Ask your healthcare provider to call the local or state health department.  Monitor your symptoms Seek prompt medical attention if your illness is worsening (e.g., difficulty breathing). Before going to your medical appointment, call the healthcare provider and tell them that you have, or are being evaluated for, COVID-19 infection. Ask your healthcare provider to call the local or state health department.  Wear a facemask You should wear a facemask that covers your nose and mouth when you are in the same room with other people and when you visit a healthcare provider. People who live with or visit you should also wear a facemask while they are in the same room with you.  Separate yourself from other people in your home As much as possible, you should stay in a different room from other people in your home. Also, you should use a separate bathroom, if available.  Avoid sharing household items You should not share  dishes, drinking glasses, cups, eating utensils, towels, bedding, or other items with other people in your home. After using these items, you should wash them thoroughly with soap and water.  Cover your coughs and sneezes Cover your mouth and nose with a tissue when you cough or sneeze, or you can cough or sneeze into your sleeve. Throw used tissues in a lined trash can, and immediately wash your hands with soap and water for at least 20 seconds or use an alcohol-based hand rub.  Wash your [employer] your hands often and thoroughly with soap and water for at least 20 seconds. You can use an alcohol-based hand sanitizer if soap and water are not available and if your hands are not visibly dirty. Avoid touching your eyes, nose, and mouth with unwashed hands.   Prevention Steps for Caregivers and Household Members of Individuals Confirmed to have, or Being Evaluated for, COVID-19 Infection Being Cared for in the Home  If you live with, or provide care at home for, a person confirmed to have, or being evaluated for, COVID-19 infection please follow these guidelines to prevent infection:  Follow healthcare provider's instructions Make sure that you understand and can help the patient follow any healthcare provider instructions for all care.  Provide for the patient's basic needs You should help the patient with basic needs in the home and provide support for getting groceries, prescriptions, and other personal needs.  Monitor the patient's symptoms If they are getting sicker, call his or her medical provider and tell them that the patient has, or is being evaluated for, COVID-19 infection. This will help the healthcare provider's office  take steps to keep other people from getting infected. Ask the healthcare provider to call the local or state health department.  Limit the number of people who have contact with the patient  If possible, have only one caregiver for the patient.  Other  household members should stay in another home or place of residence. If this is not possible, they should stay  in another room, or be separated from the patient as much as possible. Use a separate bathroom, if available.  Restrict visitors who do not have an essential need to be in the home.  Keep older adults, very young children, and other sick people away from the patient Keep older adults, very young children, and those who have compromised immune systems or chronic health conditions away from the patient. This includes people with chronic heart, lung, or kidney conditions, diabetes, and cancer.  Ensure good ventilation Make sure that shared spaces in the home have good air flow, such as from an air conditioner or an opened window, weather permitting.  Wash your hands often  Wash your hands often and thoroughly with soap and water for at least 20 seconds. You can use an alcohol based hand sanitizer if soap and water are not available and if your hands are not visibly dirty.  Avoid touching your eyes, nose, and mouth with unwashed hands.  Use disposable paper towels to dry your hands. If not available, use dedicated cloth towels and replace them when they become wet.  Wear a facemask and gloves  Wear a disposable facemask at all times in the room and gloves when you touch or have contact with the patient's blood, body fluids, and/or secretions or excretions, such as sweat, saliva, sputum, nasal mucus, vomit, urine, or feces.  Ensure the mask fits over your nose and mouth tightly, and do not touch it during use.  Throw out disposable facemasks and gloves after using them. Do not reuse.  Wash your hands immediately after removing your facemask and gloves.  If your personal clothing becomes contaminated, carefully remove clothing and launder. Wash your hands after handling contaminated clothing.  Place all used disposable facemasks, gloves, and other waste in a lined container before  disposing them with other household waste.  Remove gloves and wash your hands immediately after handling these items.  Do not share dishes, glasses, or other household items with the patient  Avoid sharing household items. You should not share dishes, drinking glasses, cups, eating utensils, towels, bedding, or other items with a patient who is confirmed to have, or being evaluated for, COVID-19 infection.  After the person uses these items, you should wash them thoroughly with soap and water.  Wash laundry thoroughly  Immediately remove and wash clothes or bedding that have blood, body fluids, and/or secretions or excretions, such as sweat, saliva, sputum, nasal mucus, vomit, urine, or feces, on them.  Wear gloves when handling laundry from the patient.  Read and follow directions on labels of laundry or clothing items and detergent. In general, wash and dry with the warmest temperatures recommended on the label.  Clean all areas the individual has used often  Clean all touchable surfaces, such as counters, tabletops, doorknobs, bathroom fixtures, toilets, phones, keyboards, tablets, and bedside tables, every day. Also, clean any surfaces that may have blood, body fluids, and/or secretions or excretions on them.  Wear gloves when cleaning surfaces the patient has come in contact with.  Use a diluted bleach solution (e.g., dilute bleach with 1 part  bleach and 10 parts water) or a household disinfectant with a label that says EPA-registered for coronaviruses. To make a bleach solution at home, add 1 tablespoon of bleach to 1 quart (4 cups) of water. For a larger supply, add  cup of bleach to 1 gallon (16 cups) of water.  Read labels of cleaning products and follow recommendations provided on product labels. Labels contain instructions for safe and effective use of the cleaning product including precautions you should take when applying the product, such as wearing gloves or eye protection  and making sure you have good ventilation during use of the product.  Remove gloves and wash hands immediately after cleaning.  Monitor yourself for signs and symptoms of illness Caregivers and household members are considered close contacts, should monitor their health, and will be asked to limit movement outside of the home to the extent possible. Follow the monitoring steps for close contacts listed on the symptom monitoring form.   ? If you have additional questions, contact your local health department or call the epidemiologist on call at [phone] (available 24/7). ? This guidance is subject to change. For the most up-to-date guidance from [facility], please refer to their website: [email or web address]

## 2020-09-14 NOTE — TOC Transition Note (Signed)
Transition of Care (TOC) - CM/SW Discharge Note Donn Pierini RN, BSN Transitions of Care Unit 4E- RN Case Manager See Treatment Team for direct phone # Cross Coverage for 5W   Patient Details  Name: Seth Garcia MRN: [id number] Date of Birth: [birth year]  Transition of Care [facility]) CM/SW Contact:  Darrold Span, RN Phone Number: 09/14/2020, 11:58 AM   Clinical Narrative:    Pt stable for transition home today, order placed for home 02- pt will need portable tank to transport home with.  Call made to [facility] with [employer] for home 02 needs- once processed- [employer] will bring portable equipment for transport to nursing station on 5W for patient. This will need to be given to pt for home use.  [employer] will contact pt regarding home 02 needs.   Final next level of care: Home/Self Care Barriers to Discharge: No Barriers Identified   Patient Goals and CMS Choice    DME    Discharge Placement                Home        Discharge Plan and Services   Discharge Planning Services: CM Consult Post Acute Care Choice: Durable Medical Equipment          DME Arranged: Oxygen DME Agency: [employer] Date DME Agency Contacted: 09/14/20 Time DME Agency Contacted: 1115 Representative spoke with at DME Agency: Ian Malkin HH Arranged: NA HH Agency: NA        Social Determinants of Health (SDOH) Interventions     Readmission Risk Interventions Readmission Risk Prevention Plan 09/14/2020  Post Dischage Appt Complete  Medication Screening Complete  Transportation Screening Complete  Some recent data might be hidden

## 2020-09-16 DIAGNOSIS — U071 COVID-19: Secondary | ICD-10-CM | POA: Diagnosis not present

## 2020-09-25 DIAGNOSIS — R0902 Hypoxemia: Secondary | ICD-10-CM | POA: Diagnosis not present

## 2020-09-25 DIAGNOSIS — J9600 Acute respiratory failure, unspecified whether with hypoxia or hypercapnia: Secondary | ICD-10-CM | POA: Diagnosis not present

## 2020-09-25 DIAGNOSIS — U071 COVID-19: Secondary | ICD-10-CM | POA: Diagnosis not present

## 2020-09-25 DIAGNOSIS — R0602 Shortness of breath: Secondary | ICD-10-CM | POA: Diagnosis not present

## 2020-09-28 ENCOUNTER — Ambulatory Visit
Admission: RE | Admit: 2020-09-28 | Discharge: 2020-09-28 | Disposition: A | Discharge status: Home / Self Care | Payer: BC Managed Care – PPO | Source: Ambulatory Visit | Attending: Family Medicine | Admitting: Family Medicine

## 2020-09-28 ENCOUNTER — Other Ambulatory Visit: Payer: Self-pay | Admitting: Family Medicine

## 2020-09-28 DIAGNOSIS — R748 Abnormal levels of other serum enzymes: Secondary | ICD-10-CM | POA: Diagnosis not present

## 2020-09-28 DIAGNOSIS — R0602 Shortness of breath: Secondary | ICD-10-CM

## 2020-09-28 DIAGNOSIS — U071 COVID-19: Secondary | ICD-10-CM | POA: Diagnosis not present

## 2020-10-15 ENCOUNTER — Other Ambulatory Visit: Payer: Self-pay | Admitting: Family Medicine

## 2020-10-15 ENCOUNTER — Ambulatory Visit
Admission: RE | Admit: 2020-10-15 | Discharge: 2020-10-15 | Disposition: A | Discharge status: Home / Self Care | Payer: BC Managed Care – PPO | Source: Ambulatory Visit | Attending: Family Medicine | Admitting: Family Medicine

## 2020-10-15 ENCOUNTER — Other Ambulatory Visit: Payer: Self-pay

## 2020-10-15 DIAGNOSIS — R0902 Hypoxemia: Secondary | ICD-10-CM | POA: Diagnosis not present

## 2020-10-15 DIAGNOSIS — R059 Cough, unspecified: Secondary | ICD-10-CM | POA: Diagnosis not present

## 2020-10-15 DIAGNOSIS — R748 Abnormal levels of other serum enzymes: Secondary | ICD-10-CM | POA: Diagnosis not present

## 2020-10-15 DIAGNOSIS — R0602 Shortness of breath: Secondary | ICD-10-CM | POA: Diagnosis not present

## 2020-10-15 DIAGNOSIS — I10 Essential (primary) hypertension: Secondary | ICD-10-CM | POA: Diagnosis not present

## 2020-10-15 DIAGNOSIS — U071 COVID-19: Secondary | ICD-10-CM | POA: Diagnosis not present

## 2020-10-17 DIAGNOSIS — U071 COVID-19: Secondary | ICD-10-CM | POA: Diagnosis not present

## 2020-11-06 ENCOUNTER — Institutional Professional Consult (permissible substitution): Payer: BC Managed Care – PPO | Admitting: Pulmonary Disease

## 2020-11-13 DIAGNOSIS — Z125 Encounter for screening for malignant neoplasm of prostate: Secondary | ICD-10-CM | POA: Diagnosis not present

## 2020-11-13 DIAGNOSIS — Z8616 Personal history of COVID-19: Secondary | ICD-10-CM | POA: Diagnosis not present

## 2020-11-13 DIAGNOSIS — Z13220 Encounter for screening for lipoid disorders: Secondary | ICD-10-CM | POA: Diagnosis not present

## 2020-11-13 DIAGNOSIS — I10 Essential (primary) hypertension: Secondary | ICD-10-CM | POA: Diagnosis not present

## 2020-11-13 DIAGNOSIS — Z130 Encounter for screening for diseases of the blood and blood-forming organs and certain disorders involving the immune mechanism: Secondary | ICD-10-CM | POA: Diagnosis not present

## 2020-11-13 DIAGNOSIS — Z0000 Encounter for general adult medical examination without abnormal findings: Secondary | ICD-10-CM | POA: Diagnosis not present

## 2020-11-15 DIAGNOSIS — U071 COVID-19: Secondary | ICD-10-CM | POA: Diagnosis not present

## 2020-11-20 ENCOUNTER — Other Ambulatory Visit: Payer: Self-pay

## 2020-11-20 ENCOUNTER — Encounter: Payer: Self-pay | Admitting: Pulmonary Disease

## 2020-11-20 ENCOUNTER — Ambulatory Visit: Payer: BC Managed Care – PPO | Admitting: Pulmonary Disease

## 2020-11-20 VITALS — BP 136/72 | HR 72 | Temp 98.0°F | Ht 75.0 in | Wt 260.2 lb

## 2020-11-20 DIAGNOSIS — J1282 Pneumonia due to coronavirus disease 2019: Secondary | ICD-10-CM | POA: Diagnosis not present

## 2020-11-20 DIAGNOSIS — U071 COVID-19: Secondary | ICD-10-CM | POA: Diagnosis not present

## 2020-11-20 MED ORDER — FLOVENT HFA 110 MCG/ACT IN AERO: 2.0000 | INHALATION_SPRAY | Freq: Two times a day (BID) | RESPIRATORY_TRACT | 12 refills | Status: DC

## 2020-11-20 NOTE — Patient Instructions (Addendum)
Start flovent 2 puffs twice daily - rinse mouth out after each use  Continue albuterol as needed

## 2020-11-20 NOTE — Progress Notes (Signed)
Synopsis: Referred in January 2022 by Ladora Daniel, PA for shortness of breath  Subjective:   PATIENT ID: Seth Garcia GENDER: male DOB: 01-21-1966, MRN: [id number]   HPI  Chief Complaint  Patient presents with  . Consult    Referred by PCP post covid, sob, sinus congestion, prod cough with clear/yellow mucus.    Seth Garcia is a 55 year old male, never smoker who is referred to pulmonary clinic for shortness of breath after covid 19 pneumonia and acute respiratory failure.   He was admitted 12/1 to 12/6. He was treated with monoclonal antibodies, remdesivir which was stopped early due to liver function, actemra and steroids. he was discharged on supplemental oxygen and has since been weaned off. He is now ambulating and working on room air.   He does notice dyspnea with exertion such as taking the trash out. He denies wheezing or chest tightness.   He is a never smoker. He grew up with second hand smoke until he was 35-67 years old. He works in Aeronautical engineer and as a Geneticist, molecular. 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    He recently finished a 5 day course of prednisone with significant improvement in his symptoms.   Past Medical History:  Diagnosis Date  . Elbow pain   . GERD (gastroesophageal reflux disease)   . Hypertension   . Knee pain      Family History  Problem Relation Age of Onset  . Heart Problems Mother   . Cancer Mother        THROAT  . Diabetes Mother   . Hypertension Mother   . Heart attack Father   . Cancer Father        SKIN  . Hypertension Father   . Diabetes Father   . Healthy Brother   . Healthy Brother   . Healthy Brother   . Healthy Brother   . Healthy Daughter   . Healthy Daughter      Social History   Socioeconomic History  . Marital status: Married    Spouse name: Not on file  . Number of children: Not on file  . Years of education: Not on file  . Highest education level: Not on file   Occupational History  . Not on file  Tobacco Use  . Smoking status: Passive Smoke Exposure - Never Smoker  . Smokeless tobacco: Never Used  . Tobacco comment: father smoked in home as a child  Substance and Sexual Activity  . Alcohol use: No  . Drug use: No  . Sexual activity: Not on file    Comment: MARRIED  Other Topics Concern  . Not on file  Social History Narrative  . Not on file   Social Determinants of Health   Financial Resource Strain: Not on file  Food Insecurity: Not on file  Transportation Needs: Not on file  Physical Activity: Not on file  Stress: Not on file  Social Connections: Not on file  Intimate Partner Violence: Not on file     No Known Allergies   Outpatient Medications Prior to Visit  Medication Sig Dispense Refill  . albuterol (VENTOLIN HFA) 108 (90 Base) MCG/ACT inhaler Inhale 2 puffs into the lungs every 6 (six) hours as needed for wheezing or shortness of breath.    Marland Kitchen amLODipine (NORVASC) 5 MG tablet Take 1 tablet (5 mg total) by mouth daily. 30 tablet 0  . aspirin 81 MG tablet  Take 81 mg by mouth daily.    Marland Kitchen EPINEPHrine 0.15 MG/0.15ML IJ injection Inject 0.15 mg into the muscle as needed for anaphylaxis.    Marland Kitchen lisinopril (ZESTRIL) 20 MG tablet Take 1 tablet (20 mg total) by mouth daily. 30 tablet 0  . Omega-3 Fatty Acids (FISH OIL) 1200 MG CAPS Take 1 capsule by mouth daily.    . pantoprazole (PROTONIX) 40 MG tablet Take 1 tablet (40 mg total) by mouth daily. 30 tablet 0  . rosuvastatin (CRESTOR) 10 MG tablet Take 10 mg by mouth daily.    Marland Kitchen dexamethasone (DECADRON) 6 MG tablet Take 1 tablet (6 mg total) by mouth daily. (Patient not taking: Reported on 11/20/2020) 5 tablet 0   No facility-administered medications prior to visit.    Review of Systems  Constitutional: Negative for chills, fever, malaise/fatigue and weight loss.  HENT: Negative for congestion, sinus pain and sore throat.   Eyes: Negative.   Respiratory: Positive for shortness of  breath. Negative for cough, hemoptysis, sputum production and wheezing.   Cardiovascular: Negative for chest pain, palpitations, orthopnea, claudication and leg swelling.  Gastrointestinal: Negative for abdominal pain, heartburn, nausea and vomiting.  Genitourinary: Negative.   Musculoskeletal: Negative for joint pain and myalgias.  Skin: Negative for rash.  Neurological: Negative for weakness.  Endo/Heme/Allergies: Negative.   Psychiatric/Behavioral: Negative.       Objective:   Vitals:   11/20/20 1103  BP: 136/72  Pulse: 72  Temp: 98 F (36.7 C)  TempSrc: Temporal  SpO2: 98%  Weight: 260 lb 3.2 oz (118 kg)  Height: 6\' 3"  (1.905 m)     Physical Exam Constitutional:      General: He is not in acute distress. HENT:     Head: Normocephalic and atraumatic.  Eyes:     Extraocular Movements: Extraocular movements intact.     Conjunctiva/sclera: Conjunctivae normal.     Pupils: Pupils are equal, round, and reactive to light.  Cardiovascular:     Rate and Rhythm: Normal rate and regular rhythm.     Pulses: Normal pulses.     Heart sounds: Normal heart sounds. No murmur heard.   Pulmonary:     Effort: Pulmonary effort is normal.     Breath sounds: Normal breath sounds. No wheezing, rhonchi or rales.  Abdominal:     General: Bowel sounds are normal.     Palpations: Abdomen is soft.  Musculoskeletal:     Right lower leg: No edema.     Left lower leg: No edema.  Lymphadenopathy:     Cervical: No cervical adenopathy.  Skin:    General: Skin is warm and dry.  Neurological:     General: No focal deficit present.     Mental Status: He is alert.  Psychiatric:        Mood and Affect: Mood normal.        Behavior: Behavior normal.        Thought Content: Thought content normal.        Judgment: Judgment normal.     CBC    Component Value Date/Time   WBC 14.0 (H) 09/14/2020 0358   RBC 5.35 09/14/2020 0358   HGB 15.8 09/14/2020 0358   HCT 46.9 09/14/2020 0358    PLT 311 09/14/2020 0358   MCV 87.7 09/14/2020 0358   MCH 29.5 09/14/2020 0358   MCHC 33.7 09/14/2020 0358   RDW 12.8 09/14/2020 0358   LYMPHSABS 1.4 09/14/2020 0358   MONOABS 0.8 09/14/2020 0358  EOSABS 0.0 09/14/2020 0358   BASOSABS 0.1 09/14/2020 0358     Chest imaging: CXR 10/15/2020 Moderate to marked severity infiltrates are again seen, involving predominantly the bilateral lung bases. This is very mildly decreased in severity when compared to the prior study. There is no evidence of a pleural effusion or pneumothorax. The heart size and mediastinal contours are within normal limits. The visualized skeletal structures are unremarkable.  PFT: No flowsheet data found.  Exercise stress test 11/19/19:  Blood pressure demonstrated a normal response to exercise.  There was no ST segment deviation noted during stress.   NSR No significant arrhythmia Normal hemodynamic response         Assessment & Plan:   Pneumonia due to COVID-19 virus  Discussion: Seth Garcia is a 56 year old male, never smoker who is referred to pulmonary clinic for shortness of breath after covid 19 pneumonia and acute respiratory failure.   He is to start flovent 2 puffs twice daily for the post-covid 19 inflammation and dyspnea.   He is gradually improving and we discussed he should continue to get better albeit slowly.   Follow up in 3 months. We will consider repeat chest radiograph at that time or CT chest scan and pulmonary function tests.   Encouraged patient to get the covid 19 vaccine.   Melody Comas, MD [facility] Office: [phone]   See Amion for Pager Details    Current Outpatient Medications:  .  albuterol (VENTOLIN HFA) 108 (90 Base) MCG/ACT inhaler, Inhale 2 puffs into the lungs every 6 (six) hours as needed for wheezing or shortness of breath., Disp: , Rfl:  .  amLODipine (NORVASC) 5 MG tablet, Take 1 tablet (5 mg total) by mouth  daily., Disp: 30 tablet, Rfl: 0 .  aspirin 81 MG tablet, Take 81 mg by mouth daily., Disp: , Rfl:  .  EPINEPHrine 0.15 MG/0.15ML IJ injection, Inject 0.15 mg into the muscle as needed for anaphylaxis., Disp: , Rfl:  .  fluticasone (FLOVENT HFA) 110 MCG/ACT inhaler, Inhale 2 puffs into the lungs 2 (two) times daily., Disp: 1 each, Rfl: 12 .  lisinopril (ZESTRIL) 20 MG tablet, Take 1 tablet (20 mg total) by mouth daily., Disp: 30 tablet, Rfl: 0 .  Omega-3 Fatty Acids (FISH OIL) 1200 MG CAPS, Take 1 capsule by mouth daily., Disp: , Rfl:  .  pantoprazole (PROTONIX) 40 MG tablet, Take 1 tablet (40 mg total) by mouth daily., Disp: 30 tablet, Rfl: 0 .  rosuvastatin (CRESTOR) 10 MG tablet, Take 10 mg by mouth daily., Disp: , Rfl:

## 2021-02-05 DIAGNOSIS — I10 Essential (primary) hypertension: Secondary | ICD-10-CM | POA: Diagnosis not present

## 2021-02-05 DIAGNOSIS — R609 Edema, unspecified: Secondary | ICD-10-CM | POA: Diagnosis not present

## 2021-04-13 DIAGNOSIS — I10 Essential (primary) hypertension: Secondary | ICD-10-CM | POA: Diagnosis not present

## 2021-06-23 DIAGNOSIS — G479 Sleep disorder, unspecified: Secondary | ICD-10-CM | POA: Diagnosis not present

## 2021-06-23 DIAGNOSIS — I10 Essential (primary) hypertension: Secondary | ICD-10-CM | POA: Diagnosis not present

## 2021-07-06 DIAGNOSIS — I10 Essential (primary) hypertension: Secondary | ICD-10-CM | POA: Diagnosis not present

## 2021-09-14 DIAGNOSIS — R400 Somnolence: Secondary | ICD-10-CM | POA: Diagnosis not present

## 2021-09-14 DIAGNOSIS — R0681 Apnea, not elsewhere classified: Secondary | ICD-10-CM | POA: Diagnosis not present

## 2021-09-14 DIAGNOSIS — I10 Essential (primary) hypertension: Secondary | ICD-10-CM | POA: Diagnosis not present

## 2021-09-14 DIAGNOSIS — R0683 Snoring: Secondary | ICD-10-CM | POA: Diagnosis not present

## 2022-03-08 ENCOUNTER — Ambulatory Visit: Payer: BC Managed Care – PPO | Admitting: Pulmonary Disease

## 2022-05-16 ENCOUNTER — Encounter: Payer: Self-pay | Admitting: Pulmonary Disease

## 2022-05-16 ENCOUNTER — Ambulatory Visit: Payer: BC Managed Care – PPO | Admitting: Pulmonary Disease

## 2022-05-16 VITALS — BP 134/80 | HR 86 | Ht 75.0 in | Wt 270.0 lb

## 2022-05-16 DIAGNOSIS — U071 COVID-19: Secondary | ICD-10-CM | POA: Diagnosis not present

## 2022-05-16 DIAGNOSIS — R0683 Snoring: Secondary | ICD-10-CM | POA: Diagnosis not present

## 2022-05-16 DIAGNOSIS — J1282 Pneumonia due to coronavirus disease 2019: Secondary | ICD-10-CM

## 2022-05-16 DIAGNOSIS — I10 Essential (primary) hypertension: Secondary | ICD-10-CM

## 2022-05-16 NOTE — Patient Instructions (Addendum)
Recommend working on weight loss again - consider changing to healthier options while you're out working  We will check a home sleep study to evaluate for sleep apnea  No further testing needed to evaluate your lungs as you have recovered well from your covid infection   Follow up in 3 months

## 2022-05-16 NOTE — Progress Notes (Unsigned)
Synopsis: Referred in January 2022 by Ladora Daniel, PA for shortness of breath  Subjective:   PATIENT ID: Seth Garcia GENDER: male DOB: [birth year], MRN: [id number]   HPI  Chief Complaint  Patient presents with   Follow-up    Overdue F/U. States his breathing has improved since his visit last year. Not currently using any inhalers.    Seth Garcia is a 56 year old male, never smoker who returns to pulmonary clinic for shortness of breath after covid 19 pneumonia and acute respiratory failure.   He reports episodes of dyspnea 3 times since last visit.   He is working on his high blood pressure.  +snoring, sometimes not rested in the AM.  Multiple 15 minute naps Has gained weight over recent nights  Initial OV 11/20/20 He was admitted 12/1 to 12/6. He was treated with monoclonal antibodies, remdesivir which was stopped early due to liver function, actemra and steroids. he was discharged on supplemental oxygen and has since been weaned off. He is now ambulating and working on room air.   He does notice dyspnea with exertion such as taking the trash out. He denies wheezing or chest tightness.   He is a never smoker. He grew up with second hand smoke until he was 53-26 years old. He works in Aeronautical engineer and as a Geneticist, molecular. 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    He recently finished a 5 day course of prednisone with significant improvement in his symptoms.   Past Medical History:  Diagnosis Date   Elbow pain    GERD (gastroesophageal reflux disease)    Hypertension    Knee pain      Family History  Problem Relation Age of Onset   Heart Problems Mother    Cancer Mother        THROAT   Diabetes Mother    Hypertension Mother    Heart attack Father    Cancer Father        SKIN   Hypertension Father    Diabetes Father    Healthy Brother    Healthy Brother    Healthy Brother    Healthy Brother    Healthy Daughter    Healthy  Daughter      Social History   Socioeconomic History   Marital status: Married    Spouse name: Not on file   Number of children: Not on file   Years of education: Not on file   Highest education level: Not on file  Occupational History   Not on file  Tobacco Use   Smoking status: Never    Passive exposure: Yes   Smokeless tobacco: Never   Tobacco comments:    father smoked in home as a child  Substance and Sexual Activity   Alcohol use: No   Drug use: No   Sexual activity: Not on file    Comment: MARRIED  Other Topics Concern   Not on file  Social History Narrative   Not on file   Social Determinants of Health   Financial Resource Strain: Not on file  Food Insecurity: Not on file  Transportation Needs: Not on file  Physical Activity: Not on file  Stress: Not on file  Social Connections: Not on file  Intimate Partner Violence: Not on file     No Known Allergies   Outpatient Medications Prior to Visit  Medication Sig Dispense Refill   aspirin 81 MG tablet  Take 81 mg by mouth daily.     EPINEPHrine 0.15 MG/0.15ML IJ injection Inject 0.15 mg into the muscle as needed for anaphylaxis.     lisinopril (ZESTRIL) 20 MG tablet Take 1 tablet (20 mg total) by mouth daily. 30 tablet 0   Omega-3 Fatty Acids (FISH OIL) 1200 MG CAPS Take 1 capsule by mouth daily.     pantoprazole (PROTONIX) 40 MG tablet Take 1 tablet (40 mg total) by mouth daily. 30 tablet 0   rosuvastatin (CRESTOR) 10 MG tablet Take 10 mg by mouth daily.     albuterol (VENTOLIN HFA) 108 (90 Base) MCG/ACT inhaler Inhale 2 puffs into the lungs every 6 (six) hours as needed for wheezing or shortness of breath.     amLODipine (NORVASC) 5 MG tablet Take 1 tablet (5 mg total) by mouth daily. 30 tablet 0   fluticasone (FLOVENT HFA) 110 MCG/ACT inhaler Inhale 2 puffs into the lungs 2 (two) times daily. 1 each 12   No facility-administered medications prior to visit.    Review of Systems  Constitutional:  Negative  for chills, fever, malaise/fatigue and weight loss.  HENT:  Negative for congestion, sinus pain and sore throat.   Eyes: Negative.   Respiratory:  Positive for shortness of breath. Negative for cough, hemoptysis, sputum production and wheezing.   Cardiovascular:  Negative for chest pain, palpitations, orthopnea, claudication and leg swelling.  Gastrointestinal:  Negative for abdominal pain, heartburn, nausea and vomiting.  Genitourinary: Negative.   Musculoskeletal:  Negative for joint pain and myalgias.  Skin:  Negative for rash.  Neurological:  Negative for weakness.  Endo/Heme/Allergies: Negative.   Psychiatric/Behavioral: Negative.        Objective:   Vitals:   05/16/22 1126  BP: 134/80  Pulse: 86  SpO2: 97%  Weight: 270 lb (122.5 kg)  Height: 6\' 3"  (1.905 m)     Physical Exam Constitutional:      General: He is not in acute distress. HENT:     Head: Normocephalic and atraumatic.  Eyes:     Extraocular Movements: Extraocular movements intact.     Conjunctiva/sclera: Conjunctivae normal.     Pupils: Pupils are equal, round, and reactive to light.  Cardiovascular:     Rate and Rhythm: Normal rate and regular rhythm.     Pulses: Normal pulses.     Heart sounds: Normal heart sounds. No murmur heard. Pulmonary:     Effort: Pulmonary effort is normal.     Breath sounds: Normal breath sounds. No wheezing, rhonchi or rales.  Abdominal:     General: Bowel sounds are normal.     Palpations: Abdomen is soft.  Musculoskeletal:     Right lower leg: No edema.     Left lower leg: No edema.  Lymphadenopathy:     Cervical: No cervical adenopathy.  Skin:    General: Skin is warm and dry.  Neurological:     General: No focal deficit present.     Mental Status: He is alert.  Psychiatric:        Mood and Affect: Mood normal.        Behavior: Behavior normal.        Thought Content: Thought content normal.        Judgment: Judgment normal.     CBC    Component Value  Date/Time   WBC 14.0 (H) 09/14/2020 0358   RBC 5.35 09/14/2020 0358   HGB 15.8 09/14/2020 0358   HCT 46.9 09/14/2020 0358  PLT 311 09/14/2020 0358   MCV 87.7 09/14/2020 0358   MCH 29.5 09/14/2020 0358   MCHC 33.7 09/14/2020 0358   RDW 12.8 09/14/2020 0358   LYMPHSABS 1.4 09/14/2020 0358   MONOABS 0.8 09/14/2020 0358   EOSABS 0.0 09/14/2020 0358   BASOSABS 0.1 09/14/2020 0358     Chest imaging: CXR 10/15/2020 Moderate to marked severity infiltrates are again seen, involving predominantly the bilateral lung bases. This is very mildly decreased in severity when compared to the prior study. There is no evidence of a pleural effusion or pneumothorax. The heart size and mediastinal contours are within normal limits. The visualized skeletal structures are unremarkable.  PFT:     No data to display          Exercise stress test 11/19/19: Blood pressure demonstrated a normal response to exercise. There was no ST segment deviation noted during stress.   NSR No significant arrhythmia Normal hemodynamic response         Assessment & Plan:   Pneumonia due to COVID-19 virus  Essential hypertension  Snoring  Discussion: Seth Garcia is a 56 year old male, never smoker who returns to pulmonary clinic for shortness of breath after covid 19 pneumonia and acute respiratory failure.   He is to start flovent 2 puffs twice daily for the post-covid 19 inflammation and dyspnea.   He is gradually improving and we discussed he should continue to get better albeit slowly.   Follow up in 3 months. We will consider repeat chest radiograph at that time or CT chest scan and pulmonary function tests.   Encouraged patient to get the covid 19 vaccine.   Melody Comas, MD [facility] Office: [phone]   Current Outpatient Medications:    aspirin 81 MG tablet, Take 81 mg by mouth daily., Disp: , Rfl:    EPINEPHrine 0.15 MG/0.15ML IJ injection, Inject  0.15 mg into the muscle as needed for anaphylaxis., Disp: , Rfl:    lisinopril (ZESTRIL) 20 MG tablet, Take 1 tablet (20 mg total) by mouth daily., Disp: 30 tablet, Rfl: 0   Omega-3 Fatty Acids (FISH OIL) 1200 MG CAPS, Take 1 capsule by mouth daily., Disp: , Rfl:    pantoprazole (PROTONIX) 40 MG tablet, Take 1 tablet (40 mg total) by mouth daily., Disp: 30 tablet, Rfl: 0

## 2022-05-18 ENCOUNTER — Encounter: Payer: Self-pay | Admitting: Pulmonary Disease

## 2022-09-12 ENCOUNTER — Ambulatory Visit: Payer: BC Managed Care – PPO

## 2022-11-05 IMAGING — DX DG CHEST 1V PORT
1 series · 1 of 1 positions shown · non-contrast
Comparison: 10/24/2008

CLINICAL DATA: Shortness of breath.

EXAM:
PORTABLE CHEST 1 VIEW

[chest ap]
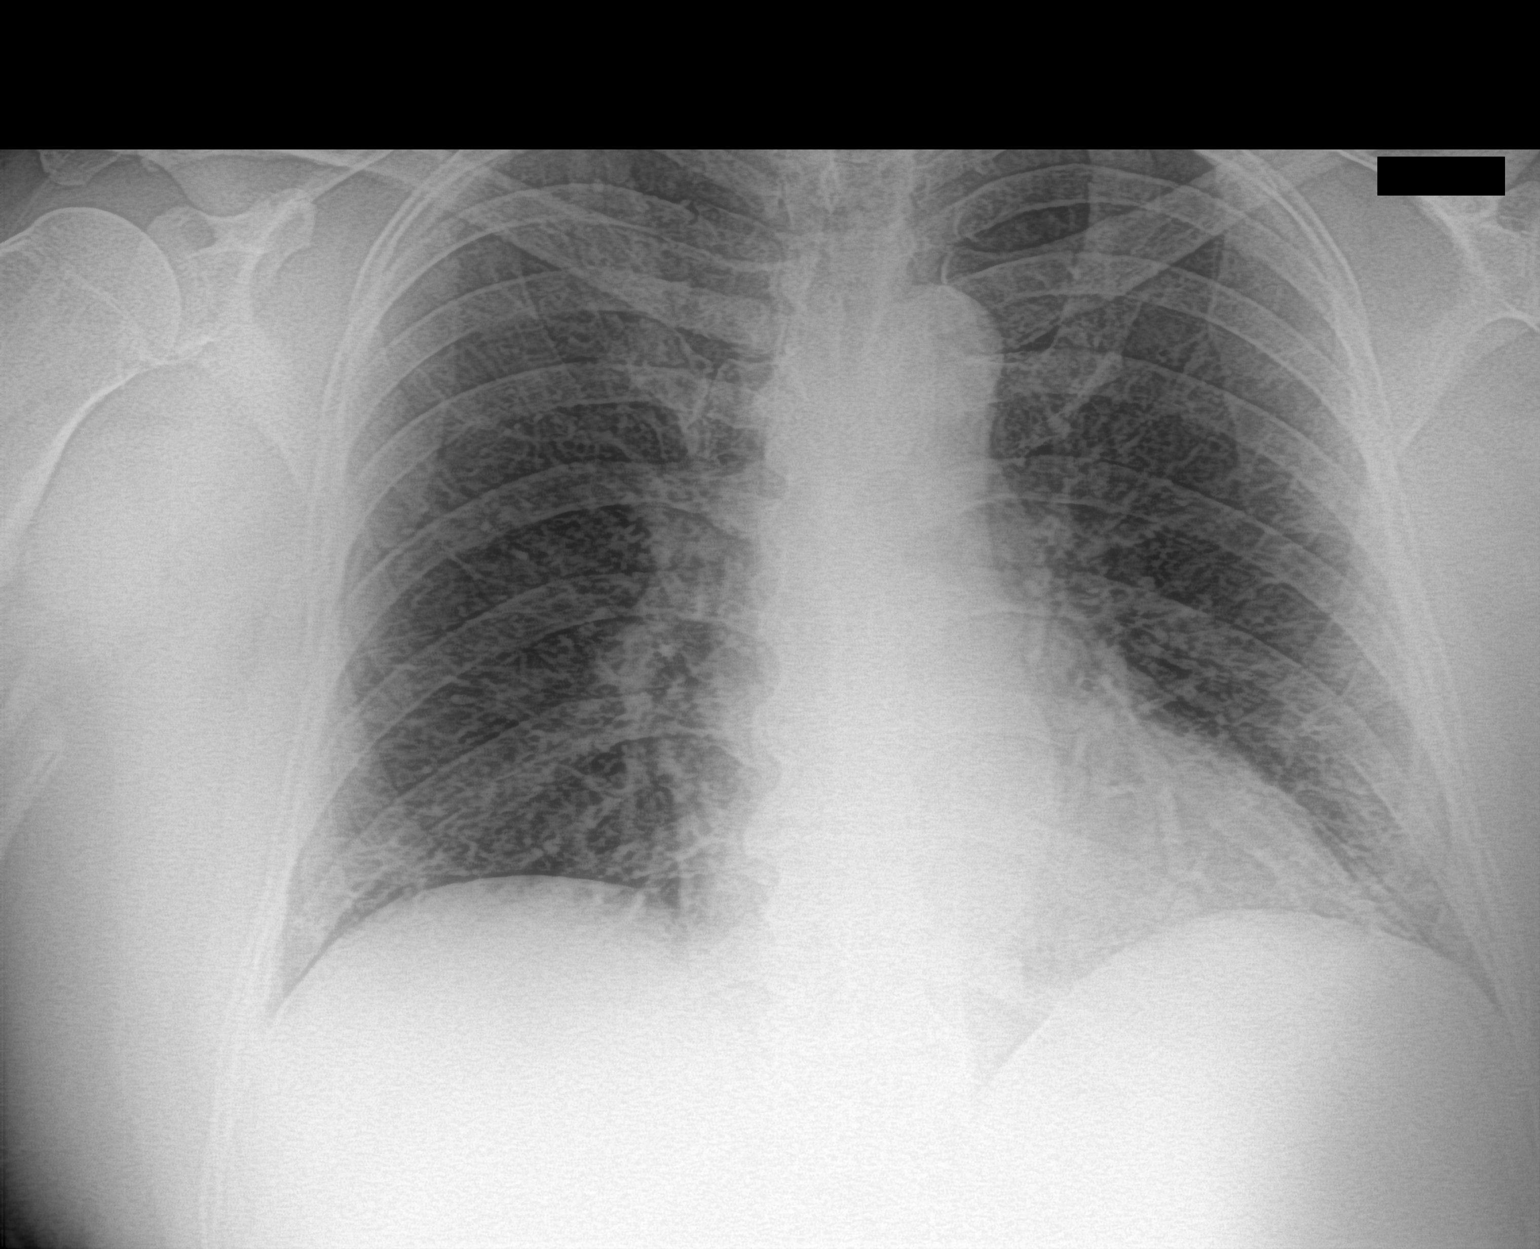

[1 of 1 positions shown; findings below may reference images not displayed]

FINDINGS: The heart size and mediastinal contours are within normal limits.
Diffuse interstitial prominence appears increased since previous
study in 0040. This may be due to worsening chronic interstitial
changes, although interstitial edema or pneumonitis cannot be
excluded. No evidence of pulmonary airspace disease or pleural
effusion.
IMPRESSION: Increased diffuse interstitial prominence since previous study in
0040. This may be due to worsening chronic interstitial changes,
although interstitial edema or pneumonitis cannot be excluded.

## 2023-02-03 ENCOUNTER — Other Ambulatory Visit: Payer: Self-pay

## 2023-02-03 ENCOUNTER — Encounter (HOSPITAL_BASED_OUTPATIENT_CLINIC_OR_DEPARTMENT_OTHER): Payer: Self-pay | Admitting: Emergency Medicine

## 2023-02-03 ENCOUNTER — Emergency Department (HOSPITAL_BASED_OUTPATIENT_CLINIC_OR_DEPARTMENT_OTHER)
Admission: EM | Admit: 2023-02-03 | Discharge: 2023-02-03 | Disposition: A | Discharge status: Home / Self Care | Payer: BC Managed Care – PPO | Attending: Emergency Medicine | Admitting: Emergency Medicine

## 2023-02-03 ENCOUNTER — Emergency Department (HOSPITAL_BASED_OUTPATIENT_CLINIC_OR_DEPARTMENT_OTHER): Payer: BC Managed Care – PPO

## 2023-02-03 DIAGNOSIS — I10 Essential (primary) hypertension: Secondary | ICD-10-CM | POA: Diagnosis not present

## 2023-02-03 DIAGNOSIS — Z1152 Encounter for screening for COVID-19: Secondary | ICD-10-CM | POA: Diagnosis not present

## 2023-02-03 DIAGNOSIS — R0789 Other chest pain: Secondary | ICD-10-CM | POA: Diagnosis present

## 2023-02-03 DIAGNOSIS — J189 Pneumonia, unspecified organism: Secondary | ICD-10-CM

## 2023-02-03 LAB — CBC
HCT: 43.9 % (ref 39.0–52.0)
Hemoglobin: 14.5 g/dL (ref 13.0–17.0)
MCH: 29.7 pg (ref 26.0–34.0)
MCHC: 33 g/dL (ref 30.0–36.0)
MCV: 89.8 fL (ref 80.0–100.0)
Platelets: 157 10*3/uL (ref 150–400)
RBC: 4.89 MIL/uL (ref 4.22–5.81)
RDW: 13.9 % (ref 11.5–15.5)
WBC: 6.9 10*3/uL (ref 4.0–10.5)
nRBC: 0 % (ref 0.0–0.2)

## 2023-02-03 LAB — SARS CORONAVIRUS 2 BY RT PCR: SARS Coronavirus 2 by RT PCR: NEGATIVE

## 2023-02-03 LAB — BASIC METABOLIC PANEL
Anion gap: 13 (ref 5–15)
BUN: 26 mg/dL — ABNORMAL HIGH (ref 6–20)
CO2: 24 mmol/L (ref 22–32)
Calcium: 9.9 mg/dL (ref 8.9–10.3)
Chloride: 102 mmol/L (ref 98–111)
Creatinine, Ser: 1.04 mg/dL (ref 0.61–1.24)
GFR, Estimated: >60 mL/min (ref >60)
Glucose, Bld: 103 mg/dL — ABNORMAL HIGH (ref 70–99)
Potassium: 4.1 mmol/L (ref 3.5–5.1)
Sodium: 139 mmol/L (ref 135–145)

## 2023-02-03 LAB — TROPONIN I (HIGH SENSITIVITY)
Troponin I (High Sensitivity): 4 ng/L (ref <18)
Troponin I (High Sensitivity): 4 ng/L (ref <18)

## 2023-02-03 MED ORDER — IOHEXOL 350 MG/ML SOLN
100.0000 mL | Freq: Once | INTRAVENOUS | Status: AC | PRN
  Administered 2023-02-03: 75 mL via INTRAVENOUS

## 2023-02-03 MED ORDER — AZITHROMYCIN 250 MG PO TABS
500.0000 mg | ORAL_TABLET | Freq: Once | ORAL | Status: AC
  Administered 2023-02-03: 500 mg via ORAL
  Filled 2023-02-03: qty 2

## 2023-02-03 NOTE — ED Triage Notes (Signed)
Pt arrives with cough and runny nose x 2 days. States last night he began to have sudden onset of central chest pressure, went to [place] [facility] for evaluation and they called him tonight saying he had an elevated d-dimer. Pt denies cp or sob at this time.

## 2023-02-03 NOTE — Discharge Instructions (Signed)
Begin taking Zithromax as prescribed.  Continue taking prednisone as previously prescribed.  Return to the emergency department if you develop worsening pain, difficulty breathing, or for other new and concerning symptoms.

## 2023-02-03 NOTE — ED Provider Notes (Signed)
[place] EMERGENCY DEPARTMENT AT [facility] Provider Note   CSN: [id number] Arrival date & time: 02/03/23  2009     History  Chief Complaint  Patient presents with   Cough   Abnormal Lab    Seth Garcia is a 57 y.o. male.  Patient is a 57 year old male with past medical history of hypertension.  Patient presenting today with complaints of chest discomfort.  This started yesterday evening while he was lying in bed.  For the past 2 days, he has had some cough and congestion, but denies any fevers.  He denies any calf pain or leg swelling.  He denies any difficulty breathing.  Patient was seen by his primary doctor earlier today and had blood work obtained.  He was called and told that his D-dimer was positive and that he should come to the ER for further evaluation.  The history is provided by the patient.       Home Medications Prior to Admission medications   Medication Sig Start Date End Date Taking? Authorizing Provider  aspirin 81 MG tablet Take 81 mg by mouth daily.    [provider]  EPINEPHrine 0.15 MG/0.15ML IJ injection Inject 0.15 mg into the muscle as needed for anaphylaxis.    [provider]  lisinopril (ZESTRIL) 20 MG tablet Take 1 tablet (20 mg total) by mouth daily. 09/15/20   Elgergawy, Leana Roe, MD  Omega-3 Fatty Acids (FISH OIL) 1200 MG CAPS Take 1 capsule by mouth daily.    [provider]  pantoprazole (PROTONIX) 40 MG tablet Take 1 tablet (40 mg total) by mouth daily. 09/15/20   Elgergawy, Leana Roe, MD      Allergies    Patient has no known allergies.    Review of Systems   Review of Systems  Physical Exam Updated Vital Signs BP 133/74   Pulse 73   Temp 98.4 F (36.9 C) (Oral)   Resp 15   Wt 119.7 kg   SpO2 95%   BMI 33.00 kg/m  Physical Exam  ED Results / Procedures / Treatments   Labs (all labs ordered are listed, but only abnormal results are displayed) Labs Reviewed  BASIC METABOLIC PANEL -  Abnormal; Notable for the following components:      Result Value   Glucose, Bld 103 (*)    BUN 26 (*)    All other components within normal limits  SARS CORONAVIRUS 2 BY RT PCR  CBC  TROPONIN I (HIGH SENSITIVITY)  TROPONIN I (HIGH SENSITIVITY)    EKG EKG Interpretation  Date/Time:  Friday February 03 2023 20:45:14 EDT Ventricular Rate:  95 PR Interval:  176 QRS Duration: 84 QT Interval:  346 QTC Calculation: 434 R Axis:   2 Text Interpretation: Normal sinus rhythm Inferior infarct , age undetermined Cannot rule out Anterior infarct (cited on or before 08-Sep-2020) Abnormal ECG When compared with ECG of 08-Sep-2020 18:59, No significant change was found Confirmed by Geoffery Lyons ([id number]) on 02/03/2023 11:03:24 PM  Radiology CT Angio Chest PE W and/or Wo Contrast  Result Date: 02/03/2023 CLINICAL DATA:  Pulmonary embolus suspected with high probability. Cough and runny nose for 2 days. Sudden onset of central chest pressure. EXAM: CT ANGIOGRAPHY CHEST WITH CONTRAST TECHNIQUE: Multidetector CT imaging of the chest was performed using the standard protocol during bolus administration of intravenous contrast. Multiplanar CT image reconstructions and MIPs were obtained to evaluate the vascular anatomy. RADIATION DOSE REDUCTION: This exam was performed according to the departmental  dose-optimization program which includes automated exposure control, adjustment of the mA and/or kV according to patient size and/or use of iterative reconstruction technique. CONTRAST:  75mL OMNIPAQUE IOHEXOL 350 MG/ML SOLN COMPARISON:  Chest radiograph 10/15/2020 FINDINGS: Cardiovascular: There is good opacification of the central and segmental pulmonary arteries. No focal filling defects. No evidence of significant pulmonary embolus. Cardiac enlargement. No pericardial effusions. Aortic arch and ascending aorta are normal in caliber. Distal descending aortic aneurysm measuring 4 cm in diameter. Scattered aortic  calcification. Mediastinum/Nodes: No enlarged mediastinal, hilar, or axillary lymph nodes. Thyroid gland, trachea, and esophagus demonstrate no significant findings. Lungs/Pleura: Patchy interstitial and alveolar infiltrates in the posterior lungs. Changes likely to represent edema or possibly multifocal pneumonia. No pleural effusions. No pneumothorax. Upper Abdomen: No acute abnormality. Musculoskeletal: Degenerative changes in the spine. Review of the MIP images confirms the above findings. IMPRESSION: 1. No evidence of significant pulmonary embolus. 2. Infiltrates in the lungs possibly edema or pneumonia. 3. Aortic atherosclerosis. Distal descending aortic aneurysm measuring 4 cm in diameter. Recommend follow-up every 12 months and vascular consultation. This recommendation follows ACR consensus guidelines: White Paper of the ACR Incidental Findings Committee II on Vascular Findings. J Am Coll Radiol 2013; 10:789-794. Electronically Signed   By: Burman Nieves M.D.   On: 02/03/2023 22:45    Procedures Procedures    Medications Ordered in ED Medications  iohexol (OMNIPAQUE) 350 MG/ML injection 100 mL (75 mLs Intravenous Contrast Given 02/03/23 2225)    ED Course/ Medical Decision Making/ A&P  Patient is a 57 year old male presenting with complaints of chest pain and elevated D-dimer as described in the HPI.  Patient arrives here with stable vital signs, is afebrile, and has no hypoxia.  Physical examination is basically unremarkable including heart, lungs, and lower extremities.  Workup initiated including CBC, metabolic panel, and troponin x 2.  COVID swab is negative.  Patient sent for a CTA of the chest showing no evidence for pulmonary embolism, but does show infiltrates in the lungs possibly related to edema/pneumonia.  Patient has no prior cardiac history and I doubt this represents pulmonary edema.  He has been coughing and congested recently and I suspect an infectious etiology.  Patient  was prescribed prednisone and Zithromax.  He has begun taking the prednisone, but has not yet filled his Zithromax.  I will give him a dose of this this evening, then have him fill the antibiotic and begin taking it tomorrow.  There is no evidence for cardiac etiology or other emergent pathology.  There is no hypoxia and vital signs are stable.  He is clinically well-appearing.  I feel as though patient can safely be discharged.  Final Clinical Impression(s) / ED Diagnoses Final diagnoses:  None    Rx / DC Orders ED Discharge Orders     None         Geoffery Lyons, MD 02/03/23 2319

## 2023-02-03 NOTE — ED Notes (Signed)
Reviewed AVS with patient, patient expressed understanding of directions, denies further questions at this time. 

## 2023-12-15 ENCOUNTER — Encounter: Payer: Self-pay | Admitting: Pulmonary Disease
